# Patient Record
Sex: Female | Born: 1935 | Race: White | Hispanic: No | Smoking: Former smoker
Health system: Southern US, Community
[De-identification: ages and names within clinical notes are randomized; demographics above are authoritative.]

## PROBLEM LIST (undated history)

## (undated) DIAGNOSIS — M1990 Unspecified osteoarthritis, unspecified site: Secondary | ICD-10-CM

## (undated) DIAGNOSIS — I350 Nonrheumatic aortic (valve) stenosis: Secondary | ICD-10-CM

## (undated) DIAGNOSIS — R053 Chronic cough: Secondary | ICD-10-CM

## (undated) DIAGNOSIS — H401132 Primary open-angle glaucoma, bilateral, moderate stage: Secondary | ICD-10-CM

## (undated) DIAGNOSIS — H409 Unspecified glaucoma: Secondary | ICD-10-CM

## (undated) DIAGNOSIS — K760 Fatty (change of) liver, not elsewhere classified: Secondary | ICD-10-CM

## (undated) DIAGNOSIS — G894 Chronic pain syndrome: Secondary | ICD-10-CM

## (undated) DIAGNOSIS — I447 Left bundle-branch block, unspecified: Secondary | ICD-10-CM

## (undated) DIAGNOSIS — N183 Chronic kidney disease, stage 3 unspecified: Secondary | ICD-10-CM

## (undated) DIAGNOSIS — R7303 Prediabetes: Secondary | ICD-10-CM

## (undated) DIAGNOSIS — I10 Essential (primary) hypertension: Secondary | ICD-10-CM

## (undated) DIAGNOSIS — L719 Rosacea, unspecified: Secondary | ICD-10-CM

## (undated) DIAGNOSIS — E785 Hyperlipidemia, unspecified: Secondary | ICD-10-CM

## (undated) DIAGNOSIS — Z961 Presence of intraocular lens: Secondary | ICD-10-CM

## (undated) DIAGNOSIS — R05 Cough: Secondary | ICD-10-CM

## (undated) DIAGNOSIS — G629 Polyneuropathy, unspecified: Secondary | ICD-10-CM

## (undated) DIAGNOSIS — R202 Paresthesia of skin: Secondary | ICD-10-CM

## (undated) HISTORY — PX: DILATION AND CURETTAGE, DIAGNOSTIC / THERAPEUTIC: SUR384

## (undated) HISTORY — DX: Chronic pain syndrome: G89.4

## (undated) HISTORY — PX: CARPAL TUNNEL RELEASE: SHX101

## (undated) HISTORY — DX: Hyperlipidemia, unspecified: E78.5

## (undated) HISTORY — DX: Essential (primary) hypertension: I10

## (undated) HISTORY — DX: Primary open-angle glaucoma, bilateral, moderate stage: H40.1132

## (undated) HISTORY — DX: Presence of intraocular lens: Z96.1

## (undated) HISTORY — DX: Paresthesia of skin: R20.2

---
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
## 2004-08-24 HISTORY — PX: EYE SURGERY: SHX253

## 2013-08-25 HISTORY — PX: CHOLECYSTECTOMY: SHX55

## 2014-10-05 DIAGNOSIS — H4011X1 Primary open-angle glaucoma, mild stage: Secondary | ICD-10-CM | POA: Diagnosis not present

## 2015-02-12 DIAGNOSIS — H4011X1 Primary open-angle glaucoma, mild stage: Secondary | ICD-10-CM | POA: Diagnosis not present

## 2015-03-19 DIAGNOSIS — H4011X1 Primary open-angle glaucoma, mild stage: Secondary | ICD-10-CM | POA: Diagnosis not present

## 2015-03-20 DIAGNOSIS — R7309 Other abnormal glucose: Secondary | ICD-10-CM | POA: Diagnosis not present

## 2015-03-20 DIAGNOSIS — Z0000 Encounter for general adult medical examination without abnormal findings: Secondary | ICD-10-CM | POA: Diagnosis not present

## 2015-03-20 DIAGNOSIS — Z79899 Other long term (current) drug therapy: Secondary | ICD-10-CM | POA: Diagnosis not present

## 2015-03-20 DIAGNOSIS — E6609 Other obesity due to excess calories: Secondary | ICD-10-CM | POA: Diagnosis not present

## 2015-03-20 DIAGNOSIS — Z1211 Encounter for screening for malignant neoplasm of colon: Secondary | ICD-10-CM | POA: Diagnosis not present

## 2015-03-20 DIAGNOSIS — N183 Chronic kidney disease, stage 3 (moderate): Secondary | ICD-10-CM | POA: Diagnosis not present

## 2015-03-20 DIAGNOSIS — Z136 Encounter for screening for cardiovascular disorders: Secondary | ICD-10-CM | POA: Diagnosis not present

## 2015-03-20 DIAGNOSIS — E785 Hyperlipidemia, unspecified: Secondary | ICD-10-CM | POA: Diagnosis not present

## 2015-03-20 DIAGNOSIS — I129 Hypertensive chronic kidney disease with stage 1 through stage 4 chronic kidney disease, or unspecified chronic kidney disease: Secondary | ICD-10-CM | POA: Diagnosis not present

## 2015-03-20 DIAGNOSIS — E162 Hypoglycemia, unspecified: Secondary | ICD-10-CM | POA: Diagnosis not present

## 2015-03-20 DIAGNOSIS — Z1239 Encounter for other screening for malignant neoplasm of breast: Secondary | ICD-10-CM | POA: Diagnosis not present

## 2015-03-20 DIAGNOSIS — Z6831 Body mass index (BMI) 31.0-31.9, adult: Secondary | ICD-10-CM | POA: Diagnosis not present

## 2015-04-06 DIAGNOSIS — Z1231 Encounter for screening mammogram for malignant neoplasm of breast: Secondary | ICD-10-CM | POA: Diagnosis not present

## 2015-04-06 DIAGNOSIS — M810 Age-related osteoporosis without current pathological fracture: Secondary | ICD-10-CM | POA: Diagnosis not present

## 2015-04-06 DIAGNOSIS — M8589 Other specified disorders of bone density and structure, multiple sites: Secondary | ICD-10-CM | POA: Diagnosis not present

## 2015-04-06 DIAGNOSIS — M8580 Other specified disorders of bone density and structure, unspecified site: Secondary | ICD-10-CM | POA: Diagnosis not present

## 2015-05-09 DIAGNOSIS — R42 Dizziness and giddiness: Secondary | ICD-10-CM | POA: Diagnosis not present

## 2015-05-14 DIAGNOSIS — R42 Dizziness and giddiness: Secondary | ICD-10-CM | POA: Diagnosis not present

## 2015-05-14 DIAGNOSIS — K921 Melena: Secondary | ICD-10-CM | POA: Diagnosis not present

## 2015-05-14 DIAGNOSIS — Z79899 Other long term (current) drug therapy: Secondary | ICD-10-CM | POA: Diagnosis not present

## 2015-05-23 DIAGNOSIS — K921 Melena: Secondary | ICD-10-CM | POA: Diagnosis not present

## 2015-07-18 DIAGNOSIS — H401131 Primary open-angle glaucoma, bilateral, mild stage: Secondary | ICD-10-CM | POA: Diagnosis not present

## 2015-07-18 DIAGNOSIS — Z961 Presence of intraocular lens: Secondary | ICD-10-CM | POA: Diagnosis not present

## 2015-09-18 DIAGNOSIS — E785 Hyperlipidemia, unspecified: Secondary | ICD-10-CM | POA: Diagnosis not present

## 2015-09-18 DIAGNOSIS — M109 Gout, unspecified: Secondary | ICD-10-CM | POA: Diagnosis not present

## 2015-09-18 DIAGNOSIS — R7303 Prediabetes: Secondary | ICD-10-CM | POA: Diagnosis not present

## 2015-09-18 DIAGNOSIS — I129 Hypertensive chronic kidney disease with stage 1 through stage 4 chronic kidney disease, or unspecified chronic kidney disease: Secondary | ICD-10-CM | POA: Diagnosis not present

## 2015-09-18 DIAGNOSIS — L821 Other seborrheic keratosis: Secondary | ICD-10-CM | POA: Diagnosis not present

## 2015-09-18 DIAGNOSIS — Z23 Encounter for immunization: Secondary | ICD-10-CM | POA: Diagnosis not present

## 2015-09-18 DIAGNOSIS — N183 Chronic kidney disease, stage 3 (moderate): Secondary | ICD-10-CM | POA: Diagnosis not present

## 2015-09-18 DIAGNOSIS — Z79899 Other long term (current) drug therapy: Secondary | ICD-10-CM | POA: Diagnosis not present

## 2015-11-12 DIAGNOSIS — H401131 Primary open-angle glaucoma, bilateral, mild stage: Secondary | ICD-10-CM | POA: Diagnosis not present

## 2015-11-12 DIAGNOSIS — Z961 Presence of intraocular lens: Secondary | ICD-10-CM | POA: Diagnosis not present

## 2016-03-17 DIAGNOSIS — H401131 Primary open-angle glaucoma, bilateral, mild stage: Secondary | ICD-10-CM | POA: Diagnosis not present

## 2016-03-17 DIAGNOSIS — H0019 Chalazion unspecified eye, unspecified eyelid: Secondary | ICD-10-CM | POA: Diagnosis not present

## 2016-03-19 DIAGNOSIS — E785 Hyperlipidemia, unspecified: Secondary | ICD-10-CM | POA: Diagnosis not present

## 2016-03-19 DIAGNOSIS — M109 Gout, unspecified: Secondary | ICD-10-CM | POA: Diagnosis not present

## 2016-03-19 DIAGNOSIS — D485 Neoplasm of uncertain behavior of skin: Secondary | ICD-10-CM | POA: Diagnosis not present

## 2016-03-19 DIAGNOSIS — Z9181 History of falling: Secondary | ICD-10-CM | POA: Diagnosis not present

## 2016-03-19 DIAGNOSIS — Z87898 Personal history of other specified conditions: Secondary | ICD-10-CM | POA: Diagnosis not present

## 2016-03-19 DIAGNOSIS — R252 Cramp and spasm: Secondary | ICD-10-CM | POA: Diagnosis not present

## 2016-03-19 DIAGNOSIS — R7303 Prediabetes: Secondary | ICD-10-CM | POA: Diagnosis not present

## 2016-03-19 DIAGNOSIS — I129 Hypertensive chronic kidney disease with stage 1 through stage 4 chronic kidney disease, or unspecified chronic kidney disease: Secondary | ICD-10-CM | POA: Diagnosis not present

## 2016-03-19 DIAGNOSIS — Z79899 Other long term (current) drug therapy: Secondary | ICD-10-CM | POA: Diagnosis not present

## 2016-03-19 DIAGNOSIS — N183 Chronic kidney disease, stage 3 (moderate): Secondary | ICD-10-CM | POA: Diagnosis not present

## 2016-03-19 DIAGNOSIS — Z1389 Encounter for screening for other disorder: Secondary | ICD-10-CM | POA: Diagnosis not present

## 2016-03-31 DIAGNOSIS — H00014 Hordeolum externum left upper eyelid: Secondary | ICD-10-CM | POA: Diagnosis not present

## 2016-04-04 DIAGNOSIS — E669 Obesity, unspecified: Secondary | ICD-10-CM | POA: Diagnosis not present

## 2016-04-04 DIAGNOSIS — Z9181 History of falling: Secondary | ICD-10-CM | POA: Diagnosis not present

## 2016-04-04 DIAGNOSIS — E782 Mixed hyperlipidemia: Secondary | ICD-10-CM | POA: Diagnosis not present

## 2016-04-04 DIAGNOSIS — Z6831 Body mass index (BMI) 31.0-31.9, adult: Secondary | ICD-10-CM | POA: Diagnosis not present

## 2016-04-04 DIAGNOSIS — Z0000 Encounter for general adult medical examination without abnormal findings: Secondary | ICD-10-CM | POA: Diagnosis not present

## 2016-04-04 DIAGNOSIS — Z136 Encounter for screening for cardiovascular disorders: Secondary | ICD-10-CM | POA: Diagnosis not present

## 2016-07-16 DIAGNOSIS — H401131 Primary open-angle glaucoma, bilateral, mild stage: Secondary | ICD-10-CM | POA: Diagnosis not present

## 2016-07-24 DIAGNOSIS — Z23 Encounter for immunization: Secondary | ICD-10-CM | POA: Diagnosis not present

## 2016-09-12 DIAGNOSIS — H401131 Primary open-angle glaucoma, bilateral, mild stage: Secondary | ICD-10-CM | POA: Diagnosis not present

## 2016-09-12 DIAGNOSIS — H00025 Hordeolum internum left lower eyelid: Secondary | ICD-10-CM | POA: Diagnosis not present

## 2016-09-12 DIAGNOSIS — Z961 Presence of intraocular lens: Secondary | ICD-10-CM | POA: Diagnosis not present

## 2016-10-02 DIAGNOSIS — M25551 Pain in right hip: Secondary | ICD-10-CM | POA: Diagnosis not present

## 2016-10-02 DIAGNOSIS — R05 Cough: Secondary | ICD-10-CM | POA: Diagnosis not present

## 2016-10-08 DIAGNOSIS — M25551 Pain in right hip: Secondary | ICD-10-CM | POA: Diagnosis not present

## 2016-10-14 DIAGNOSIS — Z79899 Other long term (current) drug therapy: Secondary | ICD-10-CM | POA: Diagnosis not present

## 2016-10-14 DIAGNOSIS — H9190 Unspecified hearing loss, unspecified ear: Secondary | ICD-10-CM | POA: Diagnosis not present

## 2016-10-14 DIAGNOSIS — R7303 Prediabetes: Secondary | ICD-10-CM | POA: Diagnosis not present

## 2016-10-14 DIAGNOSIS — M109 Gout, unspecified: Secondary | ICD-10-CM | POA: Diagnosis not present

## 2016-10-14 DIAGNOSIS — E785 Hyperlipidemia, unspecified: Secondary | ICD-10-CM | POA: Diagnosis not present

## 2016-10-14 DIAGNOSIS — I129 Hypertensive chronic kidney disease with stage 1 through stage 4 chronic kidney disease, or unspecified chronic kidney disease: Secondary | ICD-10-CM | POA: Diagnosis not present

## 2016-10-14 DIAGNOSIS — N183 Chronic kidney disease, stage 3 (moderate): Secondary | ICD-10-CM | POA: Diagnosis not present

## 2016-11-24 DIAGNOSIS — H903 Sensorineural hearing loss, bilateral: Secondary | ICD-10-CM | POA: Diagnosis not present

## 2016-11-24 DIAGNOSIS — Z961 Presence of intraocular lens: Secondary | ICD-10-CM | POA: Diagnosis not present

## 2016-11-24 DIAGNOSIS — H401131 Primary open-angle glaucoma, bilateral, mild stage: Secondary | ICD-10-CM | POA: Diagnosis not present

## 2017-03-30 DIAGNOSIS — Z961 Presence of intraocular lens: Secondary | ICD-10-CM | POA: Diagnosis not present

## 2017-03-30 DIAGNOSIS — H401131 Primary open-angle glaucoma, bilateral, mild stage: Secondary | ICD-10-CM | POA: Diagnosis not present

## 2017-04-07 DIAGNOSIS — E669 Obesity, unspecified: Secondary | ICD-10-CM | POA: Diagnosis not present

## 2017-04-07 DIAGNOSIS — R945 Abnormal results of liver function studies: Secondary | ICD-10-CM | POA: Diagnosis not present

## 2017-04-07 DIAGNOSIS — Z136 Encounter for screening for cardiovascular disorders: Secondary | ICD-10-CM | POA: Diagnosis not present

## 2017-04-07 DIAGNOSIS — Z1212 Encounter for screening for malignant neoplasm of rectum: Secondary | ICD-10-CM | POA: Diagnosis not present

## 2017-04-07 DIAGNOSIS — Z1389 Encounter for screening for other disorder: Secondary | ICD-10-CM | POA: Diagnosis not present

## 2017-04-07 DIAGNOSIS — Z0000 Encounter for general adult medical examination without abnormal findings: Secondary | ICD-10-CM | POA: Diagnosis not present

## 2017-04-07 DIAGNOSIS — E785 Hyperlipidemia, unspecified: Secondary | ICD-10-CM | POA: Diagnosis not present

## 2017-04-07 DIAGNOSIS — Z79899 Other long term (current) drug therapy: Secondary | ICD-10-CM | POA: Diagnosis not present

## 2017-04-07 DIAGNOSIS — Z6831 Body mass index (BMI) 31.0-31.9, adult: Secondary | ICD-10-CM | POA: Diagnosis not present

## 2017-04-14 DIAGNOSIS — R945 Abnormal results of liver function studies: Secondary | ICD-10-CM | POA: Diagnosis not present

## 2017-04-17 DIAGNOSIS — M810 Age-related osteoporosis without current pathological fracture: Secondary | ICD-10-CM | POA: Diagnosis not present

## 2017-04-17 DIAGNOSIS — N959 Unspecified menopausal and perimenopausal disorder: Secondary | ICD-10-CM | POA: Diagnosis not present

## 2017-04-21 DIAGNOSIS — R7303 Prediabetes: Secondary | ICD-10-CM | POA: Diagnosis not present

## 2017-04-21 DIAGNOSIS — E785 Hyperlipidemia, unspecified: Secondary | ICD-10-CM | POA: Diagnosis not present

## 2017-04-21 DIAGNOSIS — M109 Gout, unspecified: Secondary | ICD-10-CM | POA: Diagnosis not present

## 2017-04-21 DIAGNOSIS — Z79899 Other long term (current) drug therapy: Secondary | ICD-10-CM | POA: Diagnosis not present

## 2017-04-21 DIAGNOSIS — N183 Chronic kidney disease, stage 3 (moderate): Secondary | ICD-10-CM | POA: Diagnosis not present

## 2017-04-21 DIAGNOSIS — I129 Hypertensive chronic kidney disease with stage 1 through stage 4 chronic kidney disease, or unspecified chronic kidney disease: Secondary | ICD-10-CM | POA: Diagnosis not present

## 2017-04-21 DIAGNOSIS — K760 Fatty (change of) liver, not elsewhere classified: Secondary | ICD-10-CM | POA: Diagnosis not present

## 2017-04-21 DIAGNOSIS — M810 Age-related osteoporosis without current pathological fracture: Secondary | ICD-10-CM | POA: Diagnosis not present

## 2017-04-21 DIAGNOSIS — B354 Tinea corporis: Secondary | ICD-10-CM | POA: Diagnosis not present

## 2017-04-21 DIAGNOSIS — R200 Anesthesia of skin: Secondary | ICD-10-CM | POA: Diagnosis not present

## 2017-04-28 DIAGNOSIS — R932 Abnormal findings on diagnostic imaging of liver and biliary tract: Secondary | ICD-10-CM | POA: Diagnosis not present

## 2017-04-28 DIAGNOSIS — K760 Fatty (change of) liver, not elsewhere classified: Secondary | ICD-10-CM | POA: Diagnosis not present

## 2017-05-01 DIAGNOSIS — M810 Age-related osteoporosis without current pathological fracture: Secondary | ICD-10-CM | POA: Diagnosis not present

## 2017-05-05 DIAGNOSIS — R109 Unspecified abdominal pain: Secondary | ICD-10-CM | POA: Diagnosis not present

## 2017-05-05 DIAGNOSIS — R933 Abnormal findings on diagnostic imaging of other parts of digestive tract: Secondary | ICD-10-CM | POA: Diagnosis not present

## 2017-05-06 DIAGNOSIS — K760 Fatty (change of) liver, not elsewhere classified: Secondary | ICD-10-CM | POA: Diagnosis not present

## 2017-05-13 DIAGNOSIS — G609 Hereditary and idiopathic neuropathy, unspecified: Secondary | ICD-10-CM | POA: Diagnosis not present

## 2017-05-26 DIAGNOSIS — N183 Chronic kidney disease, stage 3 (moderate): Secondary | ICD-10-CM | POA: Diagnosis not present

## 2017-05-26 DIAGNOSIS — Z23 Encounter for immunization: Secondary | ICD-10-CM | POA: Diagnosis not present

## 2017-06-22 DIAGNOSIS — Z1211 Encounter for screening for malignant neoplasm of colon: Secondary | ICD-10-CM | POA: Diagnosis not present

## 2017-07-28 DIAGNOSIS — R7303 Prediabetes: Secondary | ICD-10-CM | POA: Diagnosis not present

## 2017-07-28 DIAGNOSIS — M109 Gout, unspecified: Secondary | ICD-10-CM | POA: Diagnosis not present

## 2017-07-28 DIAGNOSIS — Z79899 Other long term (current) drug therapy: Secondary | ICD-10-CM | POA: Diagnosis not present

## 2017-07-28 DIAGNOSIS — M810 Age-related osteoporosis without current pathological fracture: Secondary | ICD-10-CM | POA: Diagnosis not present

## 2017-07-28 DIAGNOSIS — N183 Chronic kidney disease, stage 3 (moderate): Secondary | ICD-10-CM | POA: Diagnosis not present

## 2017-07-28 DIAGNOSIS — D492 Neoplasm of unspecified behavior of bone, soft tissue, and skin: Secondary | ICD-10-CM | POA: Diagnosis not present

## 2017-07-28 DIAGNOSIS — E785 Hyperlipidemia, unspecified: Secondary | ICD-10-CM | POA: Diagnosis not present

## 2017-07-28 DIAGNOSIS — I129 Hypertensive chronic kidney disease with stage 1 through stage 4 chronic kidney disease, or unspecified chronic kidney disease: Secondary | ICD-10-CM | POA: Diagnosis not present

## 2017-07-31 DIAGNOSIS — H401131 Primary open-angle glaucoma, bilateral, mild stage: Secondary | ICD-10-CM | POA: Diagnosis not present

## 2017-07-31 DIAGNOSIS — Z961 Presence of intraocular lens: Secondary | ICD-10-CM | POA: Diagnosis not present

## 2017-08-10 DIAGNOSIS — C4442 Squamous cell carcinoma of skin of scalp and neck: Secondary | ICD-10-CM | POA: Diagnosis not present

## 2017-08-10 DIAGNOSIS — L578 Other skin changes due to chronic exposure to nonionizing radiation: Secondary | ICD-10-CM | POA: Diagnosis not present

## 2017-08-10 DIAGNOSIS — L821 Other seborrheic keratosis: Secondary | ICD-10-CM | POA: Diagnosis not present

## 2017-08-10 DIAGNOSIS — L570 Actinic keratosis: Secondary | ICD-10-CM | POA: Diagnosis not present

## 2017-08-31 DIAGNOSIS — L578 Other skin changes due to chronic exposure to nonionizing radiation: Secondary | ICD-10-CM | POA: Diagnosis not present

## 2017-08-31 DIAGNOSIS — L570 Actinic keratosis: Secondary | ICD-10-CM | POA: Diagnosis not present

## 2017-08-31 DIAGNOSIS — L820 Inflamed seborrheic keratosis: Secondary | ICD-10-CM | POA: Diagnosis not present

## 2017-08-31 DIAGNOSIS — L821 Other seborrheic keratosis: Secondary | ICD-10-CM | POA: Diagnosis not present

## 2017-08-31 DIAGNOSIS — L700 Acne vulgaris: Secondary | ICD-10-CM | POA: Diagnosis not present

## 2017-09-08 DIAGNOSIS — R932 Abnormal findings on diagnostic imaging of liver and biliary tract: Secondary | ICD-10-CM | POA: Diagnosis not present

## 2017-09-08 DIAGNOSIS — R748 Abnormal levels of other serum enzymes: Secondary | ICD-10-CM | POA: Diagnosis not present

## 2017-09-25 DIAGNOSIS — R202 Paresthesia of skin: Secondary | ICD-10-CM | POA: Insufficient documentation

## 2017-09-25 HISTORY — DX: Paresthesia of skin: R20.2

## 2017-11-17 DIAGNOSIS — M25511 Pain in right shoulder: Secondary | ICD-10-CM | POA: Diagnosis not present

## 2017-11-17 DIAGNOSIS — M109 Gout, unspecified: Secondary | ICD-10-CM | POA: Diagnosis not present

## 2017-11-17 DIAGNOSIS — R7303 Prediabetes: Secondary | ICD-10-CM | POA: Diagnosis not present

## 2017-11-17 DIAGNOSIS — I129 Hypertensive chronic kidney disease with stage 1 through stage 4 chronic kidney disease, or unspecified chronic kidney disease: Secondary | ICD-10-CM | POA: Diagnosis not present

## 2017-11-17 DIAGNOSIS — Z79899 Other long term (current) drug therapy: Secondary | ICD-10-CM | POA: Diagnosis not present

## 2017-11-17 DIAGNOSIS — E785 Hyperlipidemia, unspecified: Secondary | ICD-10-CM | POA: Diagnosis not present

## 2017-11-17 DIAGNOSIS — N183 Chronic kidney disease, stage 3 (moderate): Secondary | ICD-10-CM | POA: Diagnosis not present

## 2017-11-17 DIAGNOSIS — M810 Age-related osteoporosis without current pathological fracture: Secondary | ICD-10-CM | POA: Diagnosis not present

## 2017-11-26 DIAGNOSIS — M810 Age-related osteoporosis without current pathological fracture: Secondary | ICD-10-CM | POA: Diagnosis not present

## 2017-11-30 DIAGNOSIS — Z961 Presence of intraocular lens: Secondary | ICD-10-CM

## 2017-11-30 DIAGNOSIS — H401132 Primary open-angle glaucoma, bilateral, moderate stage: Secondary | ICD-10-CM

## 2017-11-30 HISTORY — DX: Presence of intraocular lens: Z96.1

## 2017-11-30 HISTORY — DX: Primary open-angle glaucoma, bilateral, moderate stage: H40.1132

## 2017-12-01 DIAGNOSIS — L720 Epidermal cyst: Secondary | ICD-10-CM | POA: Diagnosis not present

## 2017-12-01 DIAGNOSIS — L570 Actinic keratosis: Secondary | ICD-10-CM | POA: Diagnosis not present

## 2017-12-01 DIAGNOSIS — L578 Other skin changes due to chronic exposure to nonionizing radiation: Secondary | ICD-10-CM | POA: Diagnosis not present

## 2017-12-01 DIAGNOSIS — L821 Other seborrheic keratosis: Secondary | ICD-10-CM | POA: Diagnosis not present

## 2017-12-01 DIAGNOSIS — C44329 Squamous cell carcinoma of skin of other parts of face: Secondary | ICD-10-CM | POA: Diagnosis not present

## 2018-01-06 DIAGNOSIS — G894 Chronic pain syndrome: Secondary | ICD-10-CM

## 2018-01-06 DIAGNOSIS — R202 Paresthesia of skin: Secondary | ICD-10-CM | POA: Diagnosis not present

## 2018-01-06 HISTORY — DX: Chronic pain syndrome: G89.4

## 2018-02-22 DIAGNOSIS — R202 Paresthesia of skin: Secondary | ICD-10-CM | POA: Diagnosis not present

## 2018-02-22 DIAGNOSIS — G894 Chronic pain syndrome: Secondary | ICD-10-CM | POA: Diagnosis not present

## 2018-03-01 DIAGNOSIS — L570 Actinic keratosis: Secondary | ICD-10-CM | POA: Diagnosis not present

## 2018-03-23 DIAGNOSIS — R7303 Prediabetes: Secondary | ICD-10-CM | POA: Diagnosis not present

## 2018-03-23 DIAGNOSIS — I129 Hypertensive chronic kidney disease with stage 1 through stage 4 chronic kidney disease, or unspecified chronic kidney disease: Secondary | ICD-10-CM | POA: Diagnosis not present

## 2018-03-23 DIAGNOSIS — M109 Gout, unspecified: Secondary | ICD-10-CM | POA: Diagnosis not present

## 2018-03-23 DIAGNOSIS — E785 Hyperlipidemia, unspecified: Secondary | ICD-10-CM | POA: Diagnosis not present

## 2018-03-25 DIAGNOSIS — M7989 Other specified soft tissue disorders: Secondary | ICD-10-CM | POA: Diagnosis not present

## 2018-03-25 DIAGNOSIS — M19072 Primary osteoarthritis, left ankle and foot: Secondary | ICD-10-CM | POA: Diagnosis not present

## 2018-03-31 DIAGNOSIS — H903 Sensorineural hearing loss, bilateral: Secondary | ICD-10-CM | POA: Diagnosis not present

## 2018-03-31 DIAGNOSIS — Z961 Presence of intraocular lens: Secondary | ICD-10-CM | POA: Diagnosis not present

## 2018-03-31 DIAGNOSIS — H401132 Primary open-angle glaucoma, bilateral, moderate stage: Secondary | ICD-10-CM | POA: Diagnosis not present

## 2018-04-01 DIAGNOSIS — R600 Localized edema: Secondary | ICD-10-CM | POA: Diagnosis not present

## 2018-04-01 DIAGNOSIS — M7989 Other specified soft tissue disorders: Secondary | ICD-10-CM | POA: Diagnosis not present

## 2018-04-01 DIAGNOSIS — M79662 Pain in left lower leg: Secondary | ICD-10-CM | POA: Diagnosis not present

## 2018-04-06 DIAGNOSIS — M19011 Primary osteoarthritis, right shoulder: Secondary | ICD-10-CM | POA: Diagnosis not present

## 2018-04-08 DIAGNOSIS — N200 Calculus of kidney: Secondary | ICD-10-CM | POA: Diagnosis not present

## 2018-04-08 DIAGNOSIS — Q6239 Other obstructive defects of renal pelvis and ureter: Secondary | ICD-10-CM | POA: Diagnosis not present

## 2018-04-08 DIAGNOSIS — N10 Acute tubulo-interstitial nephritis: Secondary | ICD-10-CM | POA: Diagnosis not present

## 2018-04-30 DIAGNOSIS — R202 Paresthesia of skin: Secondary | ICD-10-CM | POA: Diagnosis not present

## 2018-04-30 DIAGNOSIS — M5442 Lumbago with sciatica, left side: Secondary | ICD-10-CM | POA: Diagnosis not present

## 2018-04-30 DIAGNOSIS — G894 Chronic pain syndrome: Secondary | ICD-10-CM | POA: Diagnosis not present

## 2018-04-30 DIAGNOSIS — M5441 Lumbago with sciatica, right side: Secondary | ICD-10-CM | POA: Diagnosis not present

## 2018-05-03 DIAGNOSIS — L570 Actinic keratosis: Secondary | ICD-10-CM | POA: Diagnosis not present

## 2018-05-03 DIAGNOSIS — L578 Other skin changes due to chronic exposure to nonionizing radiation: Secondary | ICD-10-CM | POA: Diagnosis not present

## 2018-05-04 DIAGNOSIS — M19011 Primary osteoarthritis, right shoulder: Secondary | ICD-10-CM | POA: Diagnosis not present

## 2018-05-11 DIAGNOSIS — G8929 Other chronic pain: Secondary | ICD-10-CM | POA: Diagnosis not present

## 2018-05-11 DIAGNOSIS — M5441 Lumbago with sciatica, right side: Secondary | ICD-10-CM | POA: Diagnosis not present

## 2018-05-11 DIAGNOSIS — M5442 Lumbago with sciatica, left side: Secondary | ICD-10-CM | POA: Diagnosis not present

## 2018-05-19 DIAGNOSIS — E785 Hyperlipidemia, unspecified: Secondary | ICD-10-CM

## 2018-05-19 DIAGNOSIS — I10 Essential (primary) hypertension: Secondary | ICD-10-CM

## 2018-05-19 HISTORY — DX: Essential (primary) hypertension: I10

## 2018-05-19 HISTORY — DX: Hyperlipidemia, unspecified: E78.5

## 2018-05-27 DIAGNOSIS — M48061 Spinal stenosis, lumbar region without neurogenic claudication: Secondary | ICD-10-CM | POA: Diagnosis not present

## 2018-05-27 DIAGNOSIS — M5418 Radiculopathy, sacral and sacrococcygeal region: Secondary | ICD-10-CM | POA: Diagnosis not present

## 2018-06-01 ENCOUNTER — Other Ambulatory Visit: Payer: Self-pay | Admitting: Orthopaedic Surgery

## 2018-06-01 DIAGNOSIS — M25511 Pain in right shoulder: Secondary | ICD-10-CM

## 2018-06-03 ENCOUNTER — Other Ambulatory Visit: Payer: Self-pay

## 2018-06-04 ENCOUNTER — Other Ambulatory Visit: Payer: Self-pay

## 2018-06-07 ENCOUNTER — Ambulatory Visit
Admission: RE | Admit: 2018-06-07 | Discharge: 2018-06-07 | Disposition: A | Discharge status: Home / Self Care | Payer: Medicare Other | Source: Ambulatory Visit | Attending: Orthopaedic Surgery | Admitting: Orthopaedic Surgery

## 2018-06-07 DIAGNOSIS — M19011 Primary osteoarthritis, right shoulder: Secondary | ICD-10-CM | POA: Diagnosis not present

## 2018-06-07 DIAGNOSIS — M25511 Pain in right shoulder: Secondary | ICD-10-CM

## 2018-06-23 DIAGNOSIS — Z01818 Encounter for other preprocedural examination: Secondary | ICD-10-CM | POA: Diagnosis not present

## 2018-06-23 DIAGNOSIS — E559 Vitamin D deficiency, unspecified: Secondary | ICD-10-CM | POA: Diagnosis not present

## 2018-06-23 DIAGNOSIS — R9431 Abnormal electrocardiogram [ECG] [EKG]: Secondary | ICD-10-CM | POA: Diagnosis not present

## 2018-06-23 DIAGNOSIS — Z23 Encounter for immunization: Secondary | ICD-10-CM | POA: Diagnosis not present

## 2018-07-07 ENCOUNTER — Encounter: Payer: Self-pay | Admitting: *Deleted

## 2018-07-07 ENCOUNTER — Other Ambulatory Visit: Payer: Self-pay | Admitting: *Deleted

## 2018-07-08 ENCOUNTER — Encounter: Payer: Self-pay | Admitting: Cardiology

## 2018-07-08 ENCOUNTER — Ambulatory Visit (INDEPENDENT_AMBULATORY_CARE_PROVIDER_SITE_OTHER): Payer: Medicare Other | Admitting: Cardiology

## 2018-07-08 VITALS — BP 120/66 | HR 64 | Ht 61.0 in | Wt 161.0 lb

## 2018-07-08 DIAGNOSIS — I447 Left bundle-branch block, unspecified: Secondary | ICD-10-CM

## 2018-07-08 DIAGNOSIS — E782 Mixed hyperlipidemia: Secondary | ICD-10-CM

## 2018-07-08 DIAGNOSIS — I10 Essential (primary) hypertension: Secondary | ICD-10-CM | POA: Diagnosis not present

## 2018-07-08 HISTORY — DX: Left bundle-branch block, unspecified: I44.7

## 2018-07-08 NOTE — Patient Instructions (Signed)
Medication Instructions:  Your physician recommends that you continue on your current medications as directed. Please refer to the Current Medication list given to you today.  If you need a refill on your cardiac medications before your next appointment, please call your pharmacy.   Lab work: None.  If you have labs (blood work) drawn today and your tests are completely normal, you will receive your results only by: Marland Kitchen MyChart Message (if you have MyChart) OR . A paper copy in the mail If you have any lab test that is abnormal or we need to change your treatment, we will call you to review the results.  Testing/Procedures: Your physician has requested that you have a lexiscan myoview. For further information please visit [email or web address]. Please follow instruction sheet, as given.  Your physician has requested that you have an echocardiogram. Echocardiography is a painless test that uses sound waves to create images of your heart. It provides your doctor with information about the size and shape of your heart and how well your heart's chambers and valves are working. This procedure takes approximately one hour. There are no restrictions for this procedure.    Follow-Up: At [facility], you and your health needs are our priority.  As part of our continuing mission to provide you with exceptional heart care, we have created designated Provider Care Teams.  These Care Teams include your primary Cardiologist (physician) and Advanced Practice Providers (APPs -  Physician Assistants and Nurse Practitioners) who all work together to provide you with the care you need, when you need it. You will need a follow up appointment in 1 months.  Please call our office 2 months in advance to schedule this appointment.  You may see No primary care provider on file. or another member of our [employer] Provider Team in [place]: Shirlee More, MD . Jyl Heinz, MD  Any Other Special Instructions  Will Be Listed Below (If Applicable).  Cardiac Nuclear Scan A cardiac nuclear scan is a test that measures blood flow to the heart when a person is resting and when he or she is exercising. The test looks for problems such as:  Not enough blood reaching a portion of the heart.  The heart muscle not working normally.  You may need this test if:  You have heart disease.  You have had abnormal lab results.  You have had heart surgery or angioplasty.  You have chest pain.  You have shortness of breath.  In this test, a radioactive dye (tracer) is injected into your bloodstream. After the tracer has traveled to your heart, an imaging device is used to measure how much of the tracer is absorbed by or distributed to various areas of your heart. This procedure is usually done at a hospital and takes 2-4 hours. Tell a health care provider about:  Any allergies you have.  All medicines you are taking, including vitamins, herbs, eye drops, creams, and over-the-counter medicines.  Any problems you or family members have had with the use of anesthetic medicines.  Any blood disorders you have.  Any surgeries you have had.  Any medical conditions you have.  Whether you are pregnant or may be pregnant. What are the risks? Generally, this is a safe procedure. However, problems may occur, including:  Serious chest pain and heart attack. This is only a risk if the stress portion of the test is done.  Rapid heartbeat.  Sensation of warmth in your chest. This usually passes quickly.  What  happens before the procedure?  Ask your health care provider about changing or stopping your regular medicines. This is especially important if you are taking diabetes medicines or blood thinners.  Remove your jewelry on the day of the procedure. What happens during the procedure?  An IV tube will be inserted into one of your veins.  Your health care provider will inject a small amount of  radioactive tracer through the tube.  You will wait for 20-40 minutes while the tracer travels through your bloodstream.  Your heart activity will be monitored with an electrocardiogram (ECG).  You will lie down on an exam table.  Images of your heart will be taken for about 15-20 minutes.  You may be asked to exercise on a treadmill or stationary bike. While you exercise, your heart's activity will be monitored with an ECG, and your blood pressure will be checked. If you are unable to exercise, you may be given a medicine to increase blood flow to parts of your heart.  When blood flow to your heart has peaked, a tracer will again be injected through the IV tube.  After 20-40 minutes, you will get back on the exam table and have more images taken of your heart.  When the procedure is over, your IV tube will be removed. The procedure may vary among health care providers and hospitals. Depending on the type of tracer used, scans may need to be repeated 3-4 hours later. What happens after the procedure?  Unless your health care provider tells you otherwise, you may return to your normal schedule, including diet, activities, and medicines.  Unless your health care provider tells you otherwise, you may increase your fluid intake. This will help flush the contrast dye from your body. Drink enough fluid to keep your urine clear or pale yellow.  It is up to you to get your test results. Ask your health care provider, or the department that is doing the test, when your results will be ready. Summary  A cardiac nuclear scan measures the blood flow to the heart when a person is resting and when he or she is exercising.  You may need this test if you are at risk for heart disease.  Tell your health care provider if you are pregnant.  Unless your health care provider tells you otherwise, increase your fluid intake. This will help flush the contrast dye from your body. Drink enough fluid to keep  your urine clear or pale yellow. This information is not intended to replace advice given to you by your health care provider. Make sure you discuss any questions you have with your health care provider. Document Released: 09/05/2004 Document Revised: 08/13/2016 Document Reviewed: 07/20/2013 Elsevier Interactive Patient Education  2017 [employer].  Echocardiogram An echocardiogram, or echocardiography, uses sound waves (ultrasound) to produce an image of your heart. The echocardiogram is simple, painless, obtained within a short period of time, and offers valuable information to your health care provider. The images from an echocardiogram can provide information such as:  Evidence of coronary artery disease (CAD).  Heart size.  Heart muscle function.  Heart valve function.  Aneurysm detection.  Evidence of a past heart attack.  Fluid buildup around the heart.  Heart muscle thickening.  Assess heart valve function.  Tell a health care provider about:  Any allergies you have.  All medicines you are taking, including vitamins, herbs, eye drops, creams, and over-the-counter medicines.  Any problems you or family members have had with  anesthetic medicines.  Any blood disorders you have.  Any surgeries you have had.  Any medical conditions you have.  Whether you are pregnant or may be pregnant. What happens before the procedure? No special preparation is needed. Eat and drink normally. What happens during the procedure?  In order to produce an image of your heart, gel will be applied to your chest and a wand-like tool (transducer) will be moved over your chest. The gel will help transmit the sound waves from the transducer. The sound waves will harmlessly bounce off your heart to allow the heart images to be captured in real-time motion. These images will then be recorded.  You may need an IV to receive a medicine that improves the quality of the pictures. What happens  after the procedure? You may return to your normal schedule including diet, activities, and medicines, unless your health care provider tells you otherwise. This information is not intended to replace advice given to you by your health care provider. Make sure you discuss any questions you have with your health care provider. Document Released: 08/08/2000 Document Revised: 03/29/2016 Document Reviewed: 04/18/2013 Elsevier Interactive Patient Education  2017 [employer].

## 2018-07-08 NOTE — Progress Notes (Signed)
Cardiology Consultation:    Date:  07/08/2018   ID:  [name], DOB [birth year], MRN [id number]  PCP:  Renaldo Reel, PA  Cardiologist:  Jenne Campus, MD   Referring MD: Orlinda Blalock, NP   Chief Complaint  Patient presents with  . Abnormal ECG  . Pre-op Exam  I have left bundle branch block and needs surgery from a shoulder  History of Present Illness:    Molly Ortega is a 84 y.o. female who is being seen today for the evaluation of left bundle branch block at the request of Orlinda Blalock, NP.  Who was referred to [place] because of abnormal EKG.  She has multiple medical issues that include hypertension borderline diabetes dyslipidemia she does have remote history of smoking but she quit many years ago multiple family members with premature coronary artery disease and sudden cardiac death.  She comes to [place] because her EKG showed left bundle branch block and EKG was done for the first time when she was evaluated before elective right shoulder replacement surgery.  She denies having dizziness or passing out she move around with the cane described to have some exertional shortness of breath.  Not paroxysmal nocturnal dyspnea no tightness squeezing pressure burning in the chest.  EKG that showed left bundle branch block was incidental discovery.  Past Medical History:  Diagnosis Date  . Chronic pain syndrome 01/06/2018  . Hyperlipidemia 05/19/2018  . Hypertension 05/19/2018  . Paresthesias 09/25/2017  . Primary open angle glaucoma (POAG) of both eyes, moderate stage 11/30/2017  . Pseudophakia of both eyes 11/30/2017      Current Medications: Current Meds  Medication Sig  . allopurinol (ZYLOPRIM) 100 MG tablet TK 1 T PO ONCE D  . dorzolamide (TRUSOPT) 2 % ophthalmic solution 1 drop 2 (two) times daily. 1 drop in affected eye(s) twice daily  . latanoprost (XALATAN) 0.005 % ophthalmic solution Apply 1 drop to eye at bedtime. Instill 1 drop into both eyes every night at bedtime  .  losartan (COZAAR) 25 MG tablet Take by mouth.  . magnesium oxide (MAG-OX) 400 MG tablet magnesium oxide 400 mg (241.3 mg magnesium) tablet  TK 1 T PO ONCE D  . Multiple Vitamin (MULTI-VITAMINS) TABS Take by mouth.  . niacin 500 MG tablet Take by mouth.  . Omega-3 1000 MG CAPS Take by mouth.  . rosuvastatin (CRESTOR) 10 MG tablet TK 1 T PO D     Allergies:   Patient has no known allergies.   Social History   Socioeconomic History  . Marital status: Widowed    Spouse name: Not on file  . Number of children: Not on file  . Years of education: Not on file  . Highest education level: Not on file  Occupational History  . Not on file  Social Needs  . Financial resource strain: Not on file  . Food insecurity:    Worry: Not on file    Inability: Not on file  . Transportation needs:    Medical: Not on file    Non-medical: Not on file  Tobacco Use  . Smoking status: Former Research scientist (life sciences)  . Smokeless tobacco: Never Used  Substance and Sexual Activity  . Alcohol use: Not Currently  . Drug use: Never  . Sexual activity: Not on file  Lifestyle  . Physical activity:    Days per week: Not on file    Minutes per session: Not on file  . Stress: Not on file  Relationships  .  Social connections:    Talks on phone: Not on file    Gets together: Not on file    Attends religious service: Not on file    Active member of club or organization: Not on file    Attends meetings of clubs or organizations: Not on file    Relationship status: Not on file  Other Topics Concern  . Not on file  Social History Narrative  . Not on file     Family History: The patient's family history includes Diabetes in her father; Heart attack in her father and mother; Lung cancer in her mother. ROS:   Please see the history of present illness.    All 14 point review of systems negative except as described per history of present illness.  EKGs/Labs/Other Studies Reviewed:    The following studies were reviewed  today:   EKG:  EKG is  ordered today.  The ekg ordered today demonstrates normal sinus rhythm normal P interval left bundle branch block.  Recent Labs: No results found for requested labs within last 8760 hours.  Recent Lipid Panel No results found for: CHOL, TRIG, HDL, CHOLHDL, VLDL, LDLCALC, LDLDIRECT  Physical Exam:    VS:  BP 120/66   Pulse 64   Ht 5\' 1"  (1.549 m)   Wt 161 lb (73 kg)   BMI 30.42 kg/m     Wt Readings from Last 3 Encounters:  07/08/18 161 lb (73 kg)     GEN:  Well nourished, well developed in no acute distress HEENT: Normal NECK: No JVD; No carotid bruits LYMPHATICS: No lymphadenopathy CARDIAC: RRR, holosystolic murmur best heard left border of the sternum grade 2/6., no rubs, no gallops RESPIRATORY:  Clear to auscultation without rales, wheezing or rhonchi  ABDOMEN: Soft, non-tender, non-distended MUSCULOSKELETAL:  No edema; No deformity  SKIN: Warm and dry NEUROLOGIC:  Alert and oriented x 3 PSYCHIATRIC:  Normal affect   ASSESSMENT:    1. Left bundle branch block   2. Essential hypertension   3. Mixed hyperlipidemia    PLAN:    In order of problems listed above:  1. Left bundle branch block which is incidental discovery in this lady with multiple risk factors for coronary artery disease.  I will ask her to have echocardiogram echocardiogram will assess left ventricular ejection fraction.  She also does have systolic murmur on the physical examination and that will be clarified by echocardiogram.  As a part of evaluation because of multiple risk factors for coronary artery disease ask her to have a stress test we will do Alpha. 2. Essential hypertension blood pressure appears to be controlled we will continue present medications. 3. Dyslipidemia with last LDL that I have available is from summer of this year when LDL was 59 HDL 46.  We will continue present management. 4. Preop evaluation for shoulder surgery because of her new left bundle branch  block we will do investigation as outlined above and then will decide about proceeding with surgery.   Medication Adjustments/Labs and Tests Ordered: Current medicines are reviewed at length with the patient today.  Concerns regarding medicines are outlined above.  Orders Placed This Encounter  Procedures  . MYOCARDIAL PERFUSION IMAGING  . ECHOCARDIOGRAM COMPLETE   No orders of the defined types were placed in this encounter.   Signed, Park Liter, MD, [facility]. 07/08/2018 12:46 PM    [place]

## 2018-07-09 ENCOUNTER — Encounter: Payer: Self-pay | Admitting: Cardiology

## 2018-07-13 DIAGNOSIS — M109 Gout, unspecified: Secondary | ICD-10-CM | POA: Diagnosis not present

## 2018-07-13 DIAGNOSIS — E785 Hyperlipidemia, unspecified: Secondary | ICD-10-CM | POA: Diagnosis not present

## 2018-07-13 DIAGNOSIS — I129 Hypertensive chronic kidney disease with stage 1 through stage 4 chronic kidney disease, or unspecified chronic kidney disease: Secondary | ICD-10-CM | POA: Diagnosis not present

## 2018-07-13 DIAGNOSIS — R7303 Prediabetes: Secondary | ICD-10-CM | POA: Diagnosis not present

## 2018-07-16 ENCOUNTER — Ambulatory Visit (HOSPITAL_BASED_OUTPATIENT_CLINIC_OR_DEPARTMENT_OTHER): Payer: Medicare Other

## 2018-07-19 ENCOUNTER — Ambulatory Visit (HOSPITAL_BASED_OUTPATIENT_CLINIC_OR_DEPARTMENT_OTHER)
Admission: RE | Admit: 2018-07-19 | Discharge: 2018-07-19 | Disposition: A | Discharge status: Home / Self Care | Payer: Medicare Other | Source: Ambulatory Visit | Attending: Cardiology | Admitting: Cardiology

## 2018-07-19 DIAGNOSIS — I447 Left bundle-branch block, unspecified: Secondary | ICD-10-CM | POA: Diagnosis not present

## 2018-07-19 NOTE — Progress Notes (Signed)
  Echocardiogram 2D Echocardiogram has been performed.  Molly Ortega [name] 07/19/2018, 1:25 PM

## 2018-08-02 DIAGNOSIS — L570 Actinic keratosis: Secondary | ICD-10-CM | POA: Diagnosis not present

## 2018-08-04 DIAGNOSIS — H26491 Other secondary cataract, right eye: Secondary | ICD-10-CM | POA: Diagnosis not present

## 2018-08-04 DIAGNOSIS — H401132 Primary open-angle glaucoma, bilateral, moderate stage: Secondary | ICD-10-CM | POA: Diagnosis not present

## 2018-08-04 DIAGNOSIS — Z961 Presence of intraocular lens: Secondary | ICD-10-CM | POA: Diagnosis not present

## 2018-08-24 ENCOUNTER — Telehealth: Payer: Self-pay | Admitting: *Deleted

## 2018-08-24 NOTE — Telephone Encounter (Signed)
Patient given detailed instructions per Myocardial Perfusion Study Information Sheet for the test on 08/31/18. Patient notified to arrive 15 minutes early and that it is imperative to arrive on time for appointment to keep from having the test rescheduled.  If you need to cancel or reschedule your appointment, please call the office within 24 hours of your appointment. . Patient verbalized understanding.Molly Ortega

## 2018-08-31 ENCOUNTER — Ambulatory Visit (INDEPENDENT_AMBULATORY_CARE_PROVIDER_SITE_OTHER): Payer: Medicare Other

## 2018-08-31 VITALS — Ht 61.0 in | Wt 161.0 lb

## 2018-08-31 DIAGNOSIS — I447 Left bundle-branch block, unspecified: Secondary | ICD-10-CM

## 2018-08-31 DIAGNOSIS — E782 Mixed hyperlipidemia: Secondary | ICD-10-CM

## 2018-08-31 DIAGNOSIS — I10 Essential (primary) hypertension: Secondary | ICD-10-CM

## 2018-08-31 MED ORDER — TECHNETIUM TC 99M TETROFOSMIN IV KIT
32.8000 | PACK | Freq: Once | INTRAVENOUS | Status: AC | PRN
  Administered 2018-08-31: 32.8 via INTRAVENOUS

## 2018-08-31 MED ORDER — TECHNETIUM TC 99M TETROFOSMIN IV KIT
10.6000 | PACK | Freq: Once | INTRAVENOUS | Status: AC | PRN
  Administered 2018-08-31: 10.6 via INTRAVENOUS

## 2018-08-31 MED ORDER — REGADENOSON 0.4 MG/5ML IV SOLN
0.4000 mg | Freq: Once | INTRAVENOUS | Status: AC
  Administered 2018-08-31: 0.4 mg via INTRAVENOUS

## 2018-09-01 ENCOUNTER — Ambulatory Visit: Payer: Medicare Other | Admitting: Cardiology

## 2018-09-01 DIAGNOSIS — G894 Chronic pain syndrome: Secondary | ICD-10-CM | POA: Diagnosis not present

## 2018-09-01 DIAGNOSIS — M48062 Spinal stenosis, lumbar region with neurogenic claudication: Secondary | ICD-10-CM

## 2018-09-01 HISTORY — DX: Spinal stenosis, lumbar region with neurogenic claudication: M48.062

## 2018-09-02 ENCOUNTER — Ambulatory Visit: Payer: Medicare Other | Admitting: Cardiology

## 2018-09-02 LAB — MYOCARDIAL PERFUSION IMAGING
CHL CUP NUCLEAR SDS: 3
CHL CUP NUCLEAR SRS: 1
CHL CUP NUCLEAR SSS: 4
LVDIAVOL: 52 mL (ref 46–106)
LVSYSVOL: 12 mL
NUC STRESS TID: 1.07
Peak HR: 75 {beats}/min
Rest HR: 62 {beats}/min

## 2018-10-01 ENCOUNTER — Ambulatory Visit (INDEPENDENT_AMBULATORY_CARE_PROVIDER_SITE_OTHER): Payer: Medicare Other | Admitting: Cardiology

## 2018-10-01 ENCOUNTER — Encounter: Payer: Self-pay | Admitting: Cardiology

## 2018-10-01 VITALS — BP 122/74 | HR 58 | Ht 61.0 in | Wt 165.6 lb

## 2018-10-01 DIAGNOSIS — I10 Essential (primary) hypertension: Secondary | ICD-10-CM | POA: Diagnosis not present

## 2018-10-01 DIAGNOSIS — I447 Left bundle-branch block, unspecified: Secondary | ICD-10-CM

## 2018-10-01 DIAGNOSIS — E782 Mixed hyperlipidemia: Secondary | ICD-10-CM | POA: Diagnosis not present

## 2018-10-01 NOTE — Progress Notes (Signed)
Cardiology Office Note:    Date:  10/01/2018   ID:  Molly Ortega, DOB [birth year], MRN [id number]  PCP:  Renaldo Reel, PA  Cardiologist:  Jenne Campus, MD    Referring MD: Renaldo Reel, [place]   Chief Complaint  Patient presents with  . Follow up on Testing  I am doing well I am here to talk about test  History of Present Illness:    Molly Ortega is a 85 y.o. female with incidentally discovered left bundle branch block.  She was also scheduled to have shoulder surgery surgery was postponed because of abnormal EKG she was referred to [place] she does not have any symptoms that would suggest coronary artery disease but because of this abnormal EKG I did echocardiogram which showed preserved left ventricular ejection fraction left ventricle hypertrophy mild mitral stenosis mild aortic stenosis, stress test was also done which was normal.  She comes here today to talk about is doing well from cardiac standpoint review she still move a lot at home slowly with some shortness of breath but otherwise seems to be doing well.  Past Medical History:  Diagnosis Date  . Chronic pain syndrome 01/06/2018  . Hyperlipidemia 05/19/2018  . Hypertension 05/19/2018  . Paresthesias 09/25/2017  . Primary open angle glaucoma (POAG) of both eyes, moderate stage 11/30/2017  . Pseudophakia of both eyes 11/30/2017      Current Medications: Current Meds  Medication Sig  . allopurinol (ZYLOPRIM) 100 MG tablet TK 1 T PO ONCE D  . dorzolamide (TRUSOPT) 2 % ophthalmic solution 1 drop 2 (two) times daily. 1 drop in affected eye(s) twice daily  . latanoprost (XALATAN) 0.005 % ophthalmic solution Apply 1 drop to eye at bedtime. Instill 1 drop into both eyes every night at bedtime  . losartan (COZAAR) 25 MG tablet Take by mouth.  . magnesium oxide (MAG-OX) 400 MG tablet magnesium oxide 400 mg (241.3 mg magnesium) tablet  TK 1 T PO ONCE D  . Multiple Vitamin (MULTI-VITAMINS) TABS Take by mouth.  . niacin 500 MG tablet  Take by mouth.  . Omega-3 1000 MG CAPS Take by mouth.  . Potassium 99 MG TABS Take 1 tablet by mouth daily.  . rosuvastatin (CRESTOR) 10 MG tablet TK 1 T PO D     Allergies:   Patient has no known allergies.   Social History   Socioeconomic History  . Marital status: Widowed    Spouse name: Not on file  . Number of children: Not on file  . Years of education: Not on file  . Highest education level: Not on file  Occupational History  . Not on file  Social Needs  . Financial resource strain: Not on file  . Food insecurity:    Worry: Not on file    Inability: Not on file  . Transportation needs:    Medical: Not on file    Non-medical: Not on file  Tobacco Use  . Smoking status: Former Research scientist (life sciences)  . Smokeless tobacco: Never Used  Substance and Sexual Activity  . Alcohol use: Not Currently  . Drug use: Never  . Sexual activity: Not on file  Lifestyle  . Physical activity:    Days per week: Not on file    Minutes per session: Not on file  . Stress: Not on file  Relationships  . Social connections:    Talks on phone: Not on file    Gets together: Not on file    Attends religious service:  Not on file    Active member of club or organization: Not on file    Attends meetings of clubs or organizations: Not on file    Relationship status: Not on file  Other Topics Concern  . Not on file  Social History Narrative  . Not on file     Family History: The patient's family history includes Diabetes in her father; Heart attack in her father and mother; Lung cancer in her mother. ROS:   Please see the history of present illness.    All 14 point review of systems negative except as described per history of present illness  EKGs/Labs/Other Studies Reviewed:      Recent Labs: No results found for requested labs within last 8760 hours.  Recent Lipid Panel No results found for: CHOL, TRIG, HDL, CHOLHDL, VLDL, LDLCALC, LDLDIRECT  Physical Exam:    VS:  BP 122/74   Pulse (!) 58    Ht 5\' 1"  (1.549 m)   Wt 165 lb 9.6 oz (75.1 kg)   SpO2 98%   BMI 31.29 kg/m     Wt Readings from Last 3 Encounters:  10/01/18 165 lb 9.6 oz (75.1 kg)  08/31/18 161 lb (73 kg)  07/08/18 161 lb (73 kg)     GEN:  Well nourished, well developed in no acute distress HEENT: Normal NECK: No JVD; No carotid bruits LYMPHATICS: No lymphadenopathy CARDIAC: RRR, no murmurs, no rubs, no gallops RESPIRATORY:  Clear to auscultation without rales, wheezing or rhonchi  ABDOMEN: Soft, non-tender, non-distended MUSCULOSKELETAL:  No edema; No deformity  SKIN: Warm and dry LOWER EXTREMITIES: no swelling NEUROLOGIC:  Alert and oriented x 3 PSYCHIATRIC:  Normal affect   ASSESSMENT:    1. Left bundle branch block   2. Essential hypertension   3. Mixed hyperlipidemia    PLAN:    In order of problems listed above:  1. Left bundle branch block incidental discovery.  Echocardiogram normal stress test no evidence of ischemia. 2. Essential hypertension blood pressure well controlled today we will continue present management. 3. Dyslipidemia she is on Crestor which I will continue. 4. Preop evaluation for right shoulder surgery.  She does have left bundle branch block by echocardiogram showed preserved left ventricular ejection fraction stress test was normal therefore I think we can proceed with surgery as scheduled.   Medication Adjustments/Labs and Tests Ordered: Current medicines are reviewed at length with the patient today.  Concerns regarding medicines are outlined above.  No orders of the defined types were placed in this encounter.  Medication changes: No orders of the defined types were placed in this encounter.   Signed, Park Liter, MD, [facility] 10/01/2018 10:23 AM    [place]

## 2018-10-01 NOTE — Patient Instructions (Signed)
Medication Instructions:  .isntcur  If you need a refill on your cardiac medications before your next appointment, please call your pharmacy.   Lab work: None.  If you have labs (blood work) drawn today and your tests are completely normal, you will receive your results only by: Marland Kitchen MyChart Message (if you have MyChart) OR . A paper copy in the mail If you have any lab test that is abnormal or we need to change your treatment, we will call you to review the results.  Testing/Procedures: None.  Follow-Up: At [facility], you and your health needs are our priority.  As part of our continuing mission to provide you with exceptional heart care, we have created designated Provider Care Teams.  These Care Teams include your primary Cardiologist (physician) and Advanced Practice Providers (APPs -  Physician Assistants and Nurse Practitioners) who all work together to provide you with the care you need, when you need it. You will need a follow up appointment in 6 months.  Please call our office 2 months in advance to schedule this appointment.  You may see No primary care provider on file. or another member of our [employer] Provider Team in [place]: Shirlee More, MD . Jyl Heinz, MD  Any Other Special Instructions Will Be Listed Below (If Applicable).

## 2018-10-28 ENCOUNTER — Other Ambulatory Visit: Payer: Self-pay

## 2018-11-24 ENCOUNTER — Encounter (HOSPITAL_COMMUNITY): Payer: Medicare Other

## 2018-11-25 DIAGNOSIS — R7303 Prediabetes: Secondary | ICD-10-CM | POA: Diagnosis not present

## 2018-11-25 DIAGNOSIS — E785 Hyperlipidemia, unspecified: Secondary | ICD-10-CM | POA: Diagnosis not present

## 2018-11-25 DIAGNOSIS — M109 Gout, unspecified: Secondary | ICD-10-CM | POA: Diagnosis not present

## 2018-11-25 DIAGNOSIS — I129 Hypertensive chronic kidney disease with stage 1 through stage 4 chronic kidney disease, or unspecified chronic kidney disease: Secondary | ICD-10-CM | POA: Diagnosis not present

## 2018-11-26 DIAGNOSIS — R7303 Prediabetes: Secondary | ICD-10-CM | POA: Diagnosis not present

## 2018-11-26 DIAGNOSIS — E785 Hyperlipidemia, unspecified: Secondary | ICD-10-CM | POA: Diagnosis not present

## 2018-11-26 DIAGNOSIS — M810 Age-related osteoporosis without current pathological fracture: Secondary | ICD-10-CM | POA: Diagnosis not present

## 2018-12-01 ENCOUNTER — Encounter (HOSPITAL_COMMUNITY): Payer: Self-pay

## 2018-12-01 ENCOUNTER — Inpatient Hospital Stay (HOSPITAL_COMMUNITY): Admit: 2018-12-01 | Payer: 59 | Admitting: Orthopaedic Surgery

## 2018-12-01 SURGERY — ARTHROPLASTY, SHOULDER, TOTAL, REVERSE
Anesthesia: Choice | Laterality: Right

## 2018-12-16 DIAGNOSIS — M810 Age-related osteoporosis without current pathological fracture: Secondary | ICD-10-CM | POA: Diagnosis not present

## 2019-01-19 DIAGNOSIS — M48062 Spinal stenosis, lumbar region with neurogenic claudication: Secondary | ICD-10-CM | POA: Diagnosis not present

## 2019-01-19 DIAGNOSIS — G894 Chronic pain syndrome: Secondary | ICD-10-CM | POA: Diagnosis not present

## 2019-01-20 DIAGNOSIS — M19012 Primary osteoarthritis, left shoulder: Secondary | ICD-10-CM | POA: Diagnosis not present

## 2019-01-20 DIAGNOSIS — R29898 Other symptoms and signs involving the musculoskeletal system: Secondary | ICD-10-CM | POA: Diagnosis not present

## 2019-01-27 NOTE — Progress Notes (Signed)
10/04/2018- Cardiac Clearance on chart from Dr. Agustin Cree  10/01/2018- noted in [place] visit from Dr. Agustin Cree  09/14/2018- Medical Clearance on chart from Alfonzo Feller   09/02/2018- noted in Epic-Stress test  07/19/2018- noted in Epic-ECHO   07/13/2018-  On chart office visit from Dr. Delena Bali and labs-CMP, lipid panel,HgA1C  07/08/2018- noted in [place]  06/23/2018- on chart-office visit from Orlinda Blalock, NP

## 2019-01-27 NOTE — Patient Instructions (Addendum)
YOU NEED TO HAVE A COVID 19 TEST ON_____Monday June 8th_______, THIS TEST MUST BE DONE BEFORE SURGERY, COME TO [place] ENTRANCE.                 Molly Ortega   Your procedure is scheduled on: Wednesday 02/02/2019  Report to [facility] Main  Entrance              Report to Short Stay at   [place] AM                  Call this number if you have problems the morning of surgery [phone]      Remember: Do not eat food  :After Midnight.                NO SOLID FOOD AFTER MIDNIGHT THE NIGHT PRIOR TO SURGERY. NOTHING BY MOUTH EXCEPT CLEAR LIQUIDS UNTIL 0430 am.               PLEASE FINISH G2 GATORADE DRINK PER SURGEON ORDER  WHICH NEEDS TO BE COMPLETED AT   0430 am.     CLEAR LIQUID DIET   Foods Allowed                                                                     Foods Excluded  Coffee and tea, regular and decaf                             liquids that you cannot  Plain Jell-O in any flavor                                             see through such as: Fruit ices (not with fruit pulp)                                     milk, soups, orange juice  Iced Popsicles                                    All solid food Carbonated beverages, regular and diet                                    Cranberry, grape and apple juices Sports drinks like Gatorade Lightly seasoned clear broth or consume(fat free) Sugar, honey syrup  Sample Menu Breakfast                                Lunch                                     Supper Cranberry juice  Beef broth                            Chicken broth Jell-O                                     Grape juice                           Apple juice Coffee or tea                        Jell-O                                      Popsicle                                                Coffee or tea                        Coffee or  tea  _____________________________________________________________________               BRUSH YOUR TEETH MORNING OF SURGERY AND RINSE YOUR MOUTH OUT, NO CHEWING GUM CANDY OR MINTS.     Take these medicines the morning of surgery with A SIP OF WATER: Allopurinol (Zyloprim), Rosuvastatin (Crestor), use eye drops                                  You may not have any metal on your body including hair pins and              piercings  Do not wear jewelry, make-up, lotions, powders or perfumes, deodorant             Do not wear nail polish.  Do not shave  48 hours prior to surgery.               Do not bring valuables to the hospital. [place].  Contacts, dentures or bridgework may not be worn into surgery.  Leave suitcase in the car. After surgery it may be brought to your room.                  Please read over the following fact sheets you were given: _____________________________________________________________________             [facility] - Preparing for Surgery Before surgery, you can play an important role.  Because skin is not sterile, your skin needs to be as free of germs as possible.  You can reduce the number of germs on your skin by washing with CHG (chlorahexidine gluconate) soap before surgery.  CHG is an antiseptic cleaner which kills germs and bonds with the skin to continue killing germs even after washing. Please DO NOT use if you have an allergy to CHG or antibacterial soaps.  If your skin becomes reddened/irritated stop using the CHG and inform  your nurse when you arrive at Short Stay. Do not shave (including legs and underarms) for at least 48 hours prior to the first CHG shower.  You may shave your face/neck. Please follow these instructions carefully:  1.  Shower with CHG Soap the night before surgery and the  morning of Surgery.  2.  If you choose to wash your hair, wash your hair first as usual with your   normal  shampoo.  3.  After you shampoo, rinse your hair and body thoroughly to remove the  shampoo.                           4.  Use CHG as you would any other liquid soap.  You can apply chg directly  to the skin and wash                       Gently with a scrungie or clean washcloth.  5.  Apply the CHG Soap to your body ONLY FROM THE NECK DOWN.   Do not use on face/ open                           Wound or open sores. Avoid contact with eyes, ears mouth and genitals (private parts).                       Wash face,  Genitals (private parts) with your normal soap.             6.  Wash thoroughly, paying special attention to the area where your surgery  will be performed.  7.  Thoroughly rinse your body with warm water from the neck down.  8.  DO NOT shower/wash with your normal soap after using and rinsing off  the CHG Soap.                9.  Pat yourself dry with a clean towel.            10.  Wear clean pajamas.            11.  Place clean sheets on your bed the night of your first shower and do not  sleep with pets. Day of Surgery : Do not apply any lotions/deodorants the morning of surgery.  Please wear clean clothes to the hospital/surgery center.  FAILURE TO FOLLOW THESE INSTRUCTIONS MAY RESULT IN THE CANCELLATION OF YOUR SURGERY PATIENT SIGNATURE_________________________________  NURSE SIGNATURE__________________________________  ________________________________________________________________________   Molly Ortega  An incentive spirometer is a tool that can help keep your lungs clear and active. This tool measures how well you are filling your lungs with each breath. Taking long deep breaths may help reverse or decrease the chance of developing breathing (pulmonary) problems (especially infection) following:  A long period of time when you are unable to move or be active. BEFORE THE PROCEDURE   If the spirometer includes an indicator to show your best effort, your  nurse or respiratory therapist will set it to a desired goal.  If possible, sit up straight or lean slightly forward. Try not to slouch.  Hold the incentive spirometer in an upright position. INSTRUCTIONS FOR USE  1. Sit on the edge of your bed if possible, or sit up as far as you can in bed or on a chair. 2. Hold the incentive spirometer in an upright position.  3. Breathe out normally. 4. Place the mouthpiece in your mouth and seal your lips tightly around it. 5. Breathe in slowly and as deeply as possible, raising the piston or the ball toward the top of the column. 6. Hold your breath for 3-5 seconds or for as long as possible. Allow the piston or ball to fall to the bottom of the column. 7. Remove the mouthpiece from your mouth and breathe out normally. 8. Rest for a few seconds and repeat Steps 1 through 7 at least 10 times every 1-2 hours when you are awake. Take your time and take a few normal breaths between deep breaths. 9. The spirometer may include an indicator to show your best effort. Use the indicator as a goal to work toward during each repetition. 10. After each set of 10 deep breaths, practice coughing to be sure your lungs are clear. If you have an incision (the cut made at the time of surgery), support your incision when coughing by placing a pillow or rolled up towels firmly against it. Once you are able to get out of bed, walk around indoors and cough well. You may stop using the incentive spirometer when instructed by your caregiver.  RISKS AND COMPLICATIONS  Take your time so you do not get dizzy or light-headed.  If you are in pain, you may need to take or ask for pain medication before doing incentive spirometry. It is harder to take a deep breath if you are having pain. AFTER USE  Rest and breathe slowly and easily.  It can be helpful to keep track of a log of your progress. Your caregiver can provide you with a simple table to help with this. If you are using the  spirometer at home, follow these instructions: [place] IF:   You are having difficultly using the spirometer.  You have trouble using the spirometer as often as instructed.  Your pain medication is not giving enough relief while using the spirometer.  You develop fever of 100.5 F (38.1 C) or higher. SEEK IMMEDIATE MEDICAL CARE IF:   You cough up bloody sputum that had not been present before.  You develop fever of 102 F (38.9 C) or greater.  You develop worsening pain at or near the incision site. MAKE SURE YOU:   Understand these instructions.  Will watch your condition.  Will get help right away if you are not doing well or get worse. Document Released: 12/22/2006 Document Revised: 11/03/2011 Document Reviewed: 02/22/2007 [facility] Patient Information 2014 [place], [place].   ________________________________________________________________________

## 2019-01-28 ENCOUNTER — Encounter (HOSPITAL_COMMUNITY)
Admission: RE | Admit: 2019-01-28 | Discharge: 2019-01-28 | Disposition: A | Discharge status: Home / Self Care | Payer: Medicare Other | Source: Ambulatory Visit | Attending: Orthopaedic Surgery | Admitting: Orthopaedic Surgery

## 2019-01-28 ENCOUNTER — Other Ambulatory Visit: Payer: Self-pay

## 2019-01-28 ENCOUNTER — Encounter (HOSPITAL_COMMUNITY): Payer: Self-pay

## 2019-01-28 DIAGNOSIS — Z01812 Encounter for preprocedural laboratory examination: Secondary | ICD-10-CM | POA: Insufficient documentation

## 2019-01-28 DIAGNOSIS — Z1159 Encounter for screening for other viral diseases: Secondary | ICD-10-CM | POA: Diagnosis not present

## 2019-01-28 HISTORY — DX: Nonrheumatic aortic (valve) stenosis: I35.0

## 2019-01-28 HISTORY — DX: Rosacea, unspecified: L71.9

## 2019-01-28 HISTORY — DX: Fatty (change of) liver, not elsewhere classified: K76.0

## 2019-01-28 HISTORY — DX: Unspecified glaucoma: H40.9

## 2019-01-28 HISTORY — DX: Unspecified osteoarthritis, unspecified site: M19.90

## 2019-01-28 HISTORY — DX: Polyneuropathy, unspecified: G62.9

## 2019-01-28 HISTORY — DX: Left bundle-branch block, unspecified: I44.7

## 2019-01-28 HISTORY — DX: Prediabetes: R73.03

## 2019-01-28 HISTORY — DX: Chronic cough: R05.3

## 2019-01-28 HISTORY — DX: Chronic kidney disease, stage 3 unspecified: N18.30

## 2019-01-28 LAB — COMPREHENSIVE METABOLIC PANEL
ALT: 19 U/L (ref 0–44)
AST: 27 U/L (ref 15–41)
Albumin: 4.4 g/dL (ref 3.5–5.0)
Alkaline Phosphatase: 46 U/L (ref 38–126)
Anion gap: 7 (ref 5–15)
BUN: 24 mg/dL — ABNORMAL HIGH (ref 8–23)
CO2: 27 mmol/L (ref 22–32)
Calcium: 8.6 mg/dL — ABNORMAL LOW (ref 8.9–10.3)
Chloride: 108 mmol/L (ref 98–111)
Creatinine, Ser: 0.98 mg/dL (ref 0.44–1.00)
GFR calc Af Amer: >60 mL/min (ref >60)
GFR calc non Af Amer: 53 mL/min — ABNORMAL LOW (ref >60)
Glucose, Bld: 120 mg/dL — ABNORMAL HIGH (ref 70–99)
Potassium: 3.7 mmol/L (ref 3.5–5.1)
Sodium: 142 mmol/L (ref 135–145)
Total Bilirubin: 0.9 mg/dL (ref 0.3–1.2)
Total Protein: 7.8 g/dL (ref 6.5–8.1)

## 2019-01-28 LAB — CBC
HCT: 39.2 % (ref 36.0–46.0)
Hemoglobin: 12.3 g/dL (ref 12.0–15.0)
MCH: 31.5 pg (ref 26.0–34.0)
MCHC: 31.4 g/dL (ref 30.0–36.0)
MCV: 100.3 fL — ABNORMAL HIGH (ref 80.0–100.0)
Platelets: 182 10*3/uL (ref 150–400)
RBC: 3.91 MIL/uL (ref 3.87–5.11)
RDW: 13.2 % (ref 11.5–15.5)
WBC: 5.9 10*3/uL (ref 4.0–10.5)
nRBC: 0 % (ref 0.0–0.2)

## 2019-01-28 LAB — SURGICAL PCR SCREEN
MRSA, PCR: NEGATIVE
Staphylococcus aureus: NEGATIVE

## 2019-01-28 NOTE — Progress Notes (Signed)
Pre-op nurse progress note   Patient reports no acute cardiac sx at pre-op appt today. VS WDL .      Please see lov cardiology 10-01-2018 regarding Stress Test results . RN unable to find results in epic.

## 2019-01-29 LAB — HEMOGLOBIN A1C
Hgb A1c MFr Bld: 5.6 % (ref 4.8–5.6)
Mean Plasma Glucose: 114 mg/dL

## 2019-01-31 ENCOUNTER — Other Ambulatory Visit (HOSPITAL_COMMUNITY)
Admission: RE | Admit: 2019-01-31 | Discharge: 2019-01-31 | Disposition: A | Discharge status: Home / Self Care | Payer: Medicare Other | Source: Ambulatory Visit | Attending: Orthopaedic Surgery | Admitting: Orthopaedic Surgery

## 2019-01-31 LAB — SARS CORONAVIRUS 2 BY RT PCR (HOSPITAL ORDER, PERFORMED IN ~~LOC~~ HOSPITAL LAB): SARS Coronavirus 2: NEGATIVE

## 2019-01-31 NOTE — Progress Notes (Signed)
Anesthesia Chart Review   Case:  [id number] Date/Time:  02/02/19 0815   Procedure:  REVERSE SHOULDER ARTHROPLASTY (Right )   Anesthesia type:  Choice   Pre-op diagnosis:  djd right shoulder   Location:  [facility] ROOM 06 / [facility]S   Surgeon:  Hiram Gash, MD      DISCUSSION: 85 yo former smoker with h/o HTN, HLD, mild Aortic stenosis, LBBB, chronic cough, pre-diabetes, CKD Stage III, DJD right shoulder scheduled for above procedure 02/02/2019 with Dr. Ophelia Charter.    Pt last seen by cardiologist, Dr. Tristan Schroeder, 10/01/2018.  Per OV note, "Preop evaluation for right shoulder surgery.  She does have left bundle branch block by echocardiogram showed preserved left ventricular ejection fraction stress test was normal therefore I think we can proceed with surgery as scheduled."  Clearance from PCP on chart.   Pt can proceed with planned procedure barring acute status change.  VS: BP 140/71   Pulse 71   Resp 16   Ht 5\' 1"  (1.549 m)   SpO2 97%   BMI 31.29 kg/m   PROVIDERS: Renaldo Reel, PA is PCP   Jenne Campus, MD is Cardiologist  LABS: Labs reviewed: Acceptable for surgery. (all labs ordered are listed, but only abnormal results are displayed)  Labs Reviewed  COMPREHENSIVE METABOLIC PANEL - Abnormal; Notable for the following components:      Result Value   Glucose, Bld 120 (*)    BUN 24 (*)    Calcium 8.6 (*)    GFR calc non Af Amer 53 (*)    All other components within normal limits  CBC - Abnormal; Notable for the following components:   MCV 100.3 (*)    All other components within normal limits  SURGICAL PCR SCREEN  HEMOGLOBIN A1C     IMAGES:   EKG: 08/31/2018 Rate 62 bpm Normal sinus rhythm  Normal ECG   CV: Stress Test 08/31/2018  The left ventricular ejection fraction is hyperdynamic (>65%).  Nuclear stress EF: 78%.  Blood pressure demonstrated a normal response to exercise.  This is a low risk study.  No evidence of ischemia or scar. Normal EF.  Transient LBBB developed in the recovery phase.  Echo 07/19/2018 Study Conclusions  - Left ventricle: The cavity size was normal. There was moderate   concentric hypertrophy. Systolic function was vigorous. The   estimated ejection fraction was in the range of 65% to 70%. Wall   motion was normal; there were no regional wall motion   abnormalities. Doppler parameters are consistent with abnormal   left ventricular relaxation (grade 1 diastolic dysfunction). - Aortic valve: There was mild stenosis. There was trivial   regurgitation. Valve area (VTI): 1.81 cm^2. Valve area (Vmax):   1.69 cm^2. Valve area (Vmean): 1.66 cm^2. - Mitral valve: Calcified annulus. Mobility was restricted. The   findings are consistent with trivial stenosis. Valve area by   continuity equation (using LVOT flow): 1.91 cm^2.  Impressions:  - Normal LVEF.   Moderate LVH.   MIld AS, MIld MS. Past Medical History:  Diagnosis Date  . Arthritis   . Chronic cough    cronic , dry ; patient states " ive had this cough for almost all my life, my old doctor, Dr Marcello Moores thought it might be from some asthma or allergies but never said for sure"  . Chronic kidney disease (CKD), stage III (moderate) (HCC)   . Chronic pain syndrome 01/06/2018  . Glaucoma   . Hepatic  steatosis   . Hyperlipidemia 05/19/2018  . Hypertension 05/19/2018  . Left bundle branch block (LBBB) on electrocardiogram   . Mild aortic valve stenosis   . Paresthesias 09/25/2017  . Peripheral neuropathy   . Pre-diabetes   . Primary open angle glaucoma (POAG) of both eyes, moderate stage 11/30/2017  . Pseudophakia of both eyes 11/30/2017  . Rosacea     Past Surgical History:  Procedure Laterality Date  . CARPAL TUNNEL RELEASE Right   . CHOLECYSTECTOMY  2015  . DILATION AND CURETTAGE, DIAGNOSTIC / THERAPEUTIC  1960s  . EYE SURGERY Bilateral 08/24/2004   cataract Basic no lensx    MEDICATIONS: . allopurinol (ZYLOPRIM) 100 MG tablet  . dorzolamide  (TRUSOPT) 2 % ophthalmic solution  . latanoprost (XALATAN) 0.005 % ophthalmic solution  . losartan (COZAAR) 100 MG tablet  . magnesium oxide (MAG-OX) 400 MG tablet  . Multiple Vitamin (MULTI-VITAMINS) TABS  . niacin 500 MG tablet  . Omega-3 1000 MG CAPS  . Potassium 99 MG TABS  . rosuvastatin (CRESTOR) 10 MG tablet   No current facility-administered medications for this encounter.      Maia Plan [facility] Pre-Surgical Testing [phone] 01/31/19 3:08 PM

## 2019-01-31 NOTE — Anesthesia Preprocedure Evaluation (Addendum)
Anesthesia Evaluation  Patient identified by MRN, date of birth, ID band Patient awake    Reviewed: Allergy & Precautions, NPO status , Patient's Chart, lab work & pertinent test results  History of Anesthesia Complications Negative for: history of anesthetic complications  Airway Mallampati: III  TM Distance: >3 FB Neck ROM: Full    Dental  (+) Edentulous Upper, Edentulous Lower   Pulmonary neg pulmonary ROS, former smoker,    Pulmonary exam normal        Cardiovascular hypertension, Normal cardiovascular exam+ Valvular Problems/Murmurs (mild) AS      Neuro/Psych negative neurological ROS  negative psych ROS   GI/Hepatic negative GI ROS, Neg liver ROS,   Endo/Other  negative endocrine ROS  Renal/GU Renal InsufficiencyRenal disease  negative genitourinary   Musculoskeletal  (+) Arthritis ,   Abdominal   Peds  Hematology negative hematology ROS (+)   Anesthesia Other Findings Pt last seen by cardiologist, Dr. Tristan Schroeder, 10/01/2018.  Per OV note, "Preop evaluation for right shoulder surgery.  She does have left bundle branch block by echocardiogram showed preserved left ventricular ejection fraction stress test was normal therefore I think we can proceed with surgery as scheduled."  Reproductive/Obstetrics                           Anesthesia Physical Anesthesia Plan  ASA: III  Anesthesia Plan: General   Post-op Pain Management: GA combined w/ Regional for post-op pain   Induction: Intravenous  PONV Risk Score and Plan: 3 and Ondansetron, Dexamethasone and Treatment may vary due to age or medical condition  Airway Management Planned: Oral ETT  Additional Equipment: None  Intra-op Plan:   Post-operative Plan: Extubation in OR  Informed Consent: I have reviewed the patients History and Physical, chart, labs and discussed the procedure including the risks, benefits and  alternatives for the proposed anesthesia with the patient or authorized representative who has indicated his/her understanding and acceptance.     Dental advisory given  Plan Discussed with:   Anesthesia Plan Comments: (See PAT note 01/28/2019, Konrad Felix, PA-C)      Anesthesia Quick Evaluation

## 2019-02-01 ENCOUNTER — Other Ambulatory Visit: Payer: Self-pay

## 2019-02-01 NOTE — Progress Notes (Signed)
SPOKE [place] dtr     SCREENING SYMPTOMS OF COVID 19:   COUGH--no  RUNNY NOSE--- no  SORE THROAT---no  NASAL CONGESTION----no  SNEEZING----no  SHORTNESS OF BREATH---no  DIFFICULTY BREATHING---no  TEMP >100.0 -----no  UNEXPLAINED BODY ACHES------no  CHILLS -------- no  HEADACHES ---------no  LOSS OF SMELL/ TASTE --------no    HAVE YOU OR ANY FAMILY MEMBER TRAVELLED PAST 14 DAYS OUT OF THE   COUNTY---no STATE----no COUNTRY----no  HAVE YOU OR ANY FAMILY MEMBER BEEN EXPOSED TO ANYONE WITH COVID 19?  no

## 2019-02-02 ENCOUNTER — Inpatient Hospital Stay (HOSPITAL_COMMUNITY): Payer: Medicare Other | Admitting: Physician Assistant

## 2019-02-02 ENCOUNTER — Encounter (HOSPITAL_COMMUNITY): Payer: Self-pay

## 2019-02-02 ENCOUNTER — Inpatient Hospital Stay (HOSPITAL_COMMUNITY)
Admission: RE | Admit: 2019-02-02 | Discharge: 2019-02-03 | DRG: 483 | Disposition: A | Discharge status: Home / Self Care | Payer: Medicare Other | Attending: Orthopaedic Surgery | Admitting: Orthopaedic Surgery

## 2019-02-02 ENCOUNTER — Inpatient Hospital Stay (HOSPITAL_COMMUNITY): Payer: Medicare Other | Admitting: Anesthesiology

## 2019-02-02 ENCOUNTER — Encounter (HOSPITAL_COMMUNITY)
Admission: RE | Disposition: A | Discharge status: Home / Self Care | Payer: Self-pay | Source: Home / Self Care | Attending: Orthopaedic Surgery

## 2019-02-02 ENCOUNTER — Inpatient Hospital Stay (HOSPITAL_COMMUNITY): Payer: Medicare Other

## 2019-02-02 DIAGNOSIS — Z09 Encounter for follow-up examination after completed treatment for conditions other than malignant neoplasm: Secondary | ICD-10-CM

## 2019-02-02 DIAGNOSIS — Z79899 Other long term (current) drug therapy: Secondary | ICD-10-CM

## 2019-02-02 DIAGNOSIS — Z87891 Personal history of nicotine dependence: Secondary | ICD-10-CM | POA: Diagnosis not present

## 2019-02-02 DIAGNOSIS — K760 Fatty (change of) liver, not elsewhere classified: Secondary | ICD-10-CM | POA: Diagnosis present

## 2019-02-02 DIAGNOSIS — N183 Chronic kidney disease, stage 3 (moderate): Secondary | ICD-10-CM | POA: Diagnosis present

## 2019-02-02 DIAGNOSIS — Z833 Family history of diabetes mellitus: Secondary | ICD-10-CM | POA: Diagnosis not present

## 2019-02-02 DIAGNOSIS — I447 Left bundle-branch block, unspecified: Secondary | ICD-10-CM | POA: Diagnosis present

## 2019-02-02 DIAGNOSIS — Z9049 Acquired absence of other specified parts of digestive tract: Secondary | ICD-10-CM | POA: Diagnosis not present

## 2019-02-02 DIAGNOSIS — G8918 Other acute postprocedural pain: Secondary | ICD-10-CM | POA: Diagnosis not present

## 2019-02-02 DIAGNOSIS — Z471 Aftercare following joint replacement surgery: Secondary | ICD-10-CM | POA: Diagnosis not present

## 2019-02-02 DIAGNOSIS — M12811 Other specific arthropathies, not elsewhere classified, right shoulder: Secondary | ICD-10-CM

## 2019-02-02 DIAGNOSIS — Z801 Family history of malignant neoplasm of trachea, bronchus and lung: Secondary | ICD-10-CM

## 2019-02-02 DIAGNOSIS — M19011 Primary osteoarthritis, right shoulder: Principal | ICD-10-CM | POA: Diagnosis present

## 2019-02-02 DIAGNOSIS — Z96611 Presence of right artificial shoulder joint: Secondary | ICD-10-CM | POA: Diagnosis not present

## 2019-02-02 DIAGNOSIS — M75101 Unspecified rotator cuff tear or rupture of right shoulder, not specified as traumatic: Secondary | ICD-10-CM | POA: Diagnosis present

## 2019-02-02 DIAGNOSIS — Z8249 Family history of ischemic heart disease and other diseases of the circulatory system: Secondary | ICD-10-CM | POA: Diagnosis not present

## 2019-02-02 DIAGNOSIS — H401132 Primary open-angle glaucoma, bilateral, moderate stage: Secondary | ICD-10-CM | POA: Diagnosis present

## 2019-02-02 DIAGNOSIS — I129 Hypertensive chronic kidney disease with stage 1 through stage 4 chronic kidney disease, or unspecified chronic kidney disease: Secondary | ICD-10-CM | POA: Diagnosis present

## 2019-02-02 DIAGNOSIS — I10 Essential (primary) hypertension: Secondary | ICD-10-CM | POA: Diagnosis not present

## 2019-02-02 DIAGNOSIS — E785 Hyperlipidemia, unspecified: Secondary | ICD-10-CM | POA: Diagnosis present

## 2019-02-02 DIAGNOSIS — G894 Chronic pain syndrome: Secondary | ICD-10-CM | POA: Diagnosis present

## 2019-02-02 HISTORY — DX: Unspecified rotator cuff tear or rupture of right shoulder, not specified as traumatic: M75.101

## 2019-02-02 HISTORY — PX: REVERSE SHOULDER ARTHROPLASTY: SHX5054

## 2019-02-02 HISTORY — DX: Other specific arthropathies, not elsewhere classified, right shoulder: M12.811

## 2019-02-02 SURGERY — ARTHROPLASTY, SHOULDER, TOTAL, REVERSE
Anesthesia: General | Laterality: Right

## 2019-02-02 MED ORDER — OXYCODONE HCL 5 MG PO TABS: 5.0000 mg | ORAL_TABLET | Freq: Once | ORAL | Status: DC | PRN

## 2019-02-02 MED ORDER — BUPIVACAINE-EPINEPHRINE (PF) 0.25% -1:200000 IJ SOLN
INTRAMUSCULAR | Status: AC
  Filled 2019-02-02: qty 30

## 2019-02-02 MED ORDER — BUPIVACAINE LIPOSOME 1.3 % IJ SUSP
INTRAMUSCULAR | Status: DC | PRN
  Administered 2019-02-02: 10 mL

## 2019-02-02 MED ORDER — SODIUM CHLORIDE 0.9 % IV SOLN
INTRAVENOUS | Status: DC | PRN
  Administered 2019-02-02: 50 ug/min via INTRAVENOUS

## 2019-02-02 MED ORDER — ONDANSETRON HCL 4 MG/2ML IJ SOLN
INTRAMUSCULAR | Status: AC
  Filled 2019-02-02: qty 2

## 2019-02-02 MED ORDER — ONDANSETRON HCL 4 MG PO TABS: 4.0000 mg | ORAL_TABLET | Freq: Four times a day (QID) | ORAL | Status: DC | PRN

## 2019-02-02 MED ORDER — CEFAZOLIN SODIUM-DEXTROSE 2-4 GM/100ML-% IV SOLN
2.0000 g | INTRAVENOUS | Status: AC
  Administered 2019-02-02: 2 g via INTRAVENOUS
  Filled 2019-02-02: qty 100

## 2019-02-02 MED ORDER — CHLORHEXIDINE GLUCONATE 4 % EX LIQD: 60.0000 mL | Freq: Once | CUTANEOUS | Status: DC

## 2019-02-02 MED ORDER — FENTANYL CITRATE (PF) 100 MCG/2ML IJ SOLN: 25.0000 ug | INTRAMUSCULAR | Status: DC | PRN

## 2019-02-02 MED ORDER — LOSARTAN POTASSIUM 50 MG PO TABS
100.0000 mg | ORAL_TABLET | Freq: Every day | ORAL | Status: DC
  Administered 2019-02-02 – 2019-02-03 (×2): 100 mg via ORAL
  Filled 2019-02-02 (×2): qty 2

## 2019-02-02 MED ORDER — METOCLOPRAMIDE HCL 5 MG PO TABS: 5.0000 mg | ORAL_TABLET | Freq: Three times a day (TID) | ORAL | Status: DC | PRN

## 2019-02-02 MED ORDER — FENTANYL CITRATE (PF) 100 MCG/2ML IJ SOLN
INTRAMUSCULAR | Status: AC
  Filled 2019-02-02: qty 2

## 2019-02-02 MED ORDER — ZOLPIDEM TARTRATE 5 MG PO TABS: 5.0000 mg | ORAL_TABLET | Freq: Every evening | ORAL | Status: DC | PRN

## 2019-02-02 MED ORDER — BUPIVACAINE HCL (PF) 0.5 % IJ SOLN
INTRAMUSCULAR | Status: DC | PRN
  Administered 2019-02-02: 15 mL via PERINEURAL

## 2019-02-02 MED ORDER — ONDANSETRON HCL 4 MG/2ML IJ SOLN: 4.0000 mg | Freq: Four times a day (QID) | INTRAMUSCULAR | Status: DC | PRN

## 2019-02-02 MED ORDER — PROPOFOL 10 MG/ML IV BOLUS
INTRAVENOUS | Status: DC | PRN
  Administered 2019-02-02: 160 mg via INTRAVENOUS

## 2019-02-02 MED ORDER — CEFAZOLIN SODIUM-DEXTROSE 1-4 GM/50ML-% IV SOLN
1.0000 g | Freq: Four times a day (QID) | INTRAVENOUS | Status: AC
  Administered 2019-02-02 – 2019-02-03 (×3): 1 g via INTRAVENOUS
  Filled 2019-02-02 (×3): qty 50

## 2019-02-02 MED ORDER — SUGAMMADEX SODIUM 200 MG/2ML IV SOLN
INTRAVENOUS | Status: DC | PRN
  Administered 2019-02-02: 100 mg via INTRAVENOUS

## 2019-02-02 MED ORDER — DEXAMETHASONE SODIUM PHOSPHATE 10 MG/ML IJ SOLN
INTRAMUSCULAR | Status: AC
  Filled 2019-02-02: qty 1

## 2019-02-02 MED ORDER — STERILE WATER FOR IRRIGATION IR SOLN
Status: DC | PRN
  Administered 2019-02-02: 2000 mL

## 2019-02-02 MED ORDER — LIDOCAINE 2% (20 MG/ML) 5 ML SYRINGE
INTRAMUSCULAR | Status: DC | PRN
  Administered 2019-02-02: 60 mg via INTRAVENOUS

## 2019-02-02 MED ORDER — LACTATED RINGERS IV SOLN
INTRAVENOUS | Status: DC
  Administered 2019-02-02 (×2): via INTRAVENOUS

## 2019-02-02 MED ORDER — PROPOFOL 10 MG/ML IV BOLUS
INTRAVENOUS | Status: AC
  Filled 2019-02-02: qty 20

## 2019-02-02 MED ORDER — 0.9 % SODIUM CHLORIDE (POUR BTL) OPTIME
TOPICAL | Status: DC | PRN
  Administered 2019-02-02: 09:00:00 1000 mL

## 2019-02-02 MED ORDER — ONDANSETRON HCL 4 MG/2ML IJ SOLN
INTRAMUSCULAR | Status: DC | PRN
  Administered 2019-02-02: 4 mg via INTRAVENOUS

## 2019-02-02 MED ORDER — DOCUSATE SODIUM 100 MG PO CAPS
100.0000 mg | ORAL_CAPSULE | Freq: Two times a day (BID) | ORAL | Status: DC
  Administered 2019-02-02 – 2019-02-03 (×2): 100 mg via ORAL
  Filled 2019-02-02 (×2): qty 1

## 2019-02-02 MED ORDER — EPHEDRINE SULFATE-NACL 50-0.9 MG/10ML-% IV SOSY
PREFILLED_SYRINGE | INTRAVENOUS | Status: DC | PRN
  Administered 2019-02-02: 10 mg via INTRAVENOUS

## 2019-02-02 MED ORDER — VANCOMYCIN HCL POWD
Status: DC | PRN
  Administered 2019-02-02: 10:00:00 1000 mg via TOPICAL

## 2019-02-02 MED ORDER — ONDANSETRON HCL 4 MG/2ML IJ SOLN: 4.0000 mg | Freq: Once | INTRAMUSCULAR | Status: DC | PRN

## 2019-02-02 MED ORDER — TRANEXAMIC ACID-NACL 1000-0.7 MG/100ML-% IV SOLN
1000.0000 mg | INTRAVENOUS | Status: AC
  Administered 2019-02-02: 1000 mg via INTRAVENOUS

## 2019-02-02 MED ORDER — FENTANYL CITRATE (PF) 100 MCG/2ML IJ SOLN
INTRAMUSCULAR | Status: DC | PRN
  Administered 2019-02-02 (×2): 50 ug via INTRAVENOUS

## 2019-02-02 MED ORDER — PHENYLEPHRINE HCL (PRESSORS) 10 MG/ML IV SOLN
INTRAVENOUS | Status: AC
  Filled 2019-02-02: qty 1

## 2019-02-02 MED ORDER — ROCURONIUM BROMIDE 10 MG/ML (PF) SYRINGE
PREFILLED_SYRINGE | INTRAVENOUS | Status: DC | PRN
  Administered 2019-02-02: 30 mg via INTRAVENOUS

## 2019-02-02 MED ORDER — HYDROMORPHONE HCL 1 MG/ML IJ SOLN: 0.5000 mg | INTRAMUSCULAR | Status: DC | PRN

## 2019-02-02 MED ORDER — EPHEDRINE 5 MG/ML INJ
INTRAVENOUS | Status: AC
  Filled 2019-02-02: qty 10

## 2019-02-02 MED ORDER — SUGAMMADEX SODIUM 200 MG/2ML IV SOLN
INTRAVENOUS | Status: AC
  Filled 2019-02-02: qty 2

## 2019-02-02 MED ORDER — OXYCODONE HCL 5 MG/5ML PO SOLN: 5.0000 mg | Freq: Once | ORAL | Status: DC | PRN

## 2019-02-02 MED ORDER — ACETAMINOPHEN 500 MG PO TABS
1000.0000 mg | ORAL_TABLET | Freq: Three times a day (TID) | ORAL | Status: DC
  Administered 2019-02-02 – 2019-02-03 (×3): 1000 mg via ORAL
  Filled 2019-02-02 (×3): qty 2

## 2019-02-02 MED ORDER — LIDOCAINE 2% (20 MG/ML) 5 ML SYRINGE
INTRAMUSCULAR | Status: AC
  Filled 2019-02-02: qty 5

## 2019-02-02 MED ORDER — VANCOMYCIN HCL 1000 MG IV SOLR
INTRAVENOUS | Status: AC
  Filled 2019-02-02: qty 1000

## 2019-02-02 MED ORDER — DEXAMETHASONE SODIUM PHOSPHATE 10 MG/ML IJ SOLN
INTRAMUSCULAR | Status: DC | PRN
  Administered 2019-02-02: 10 mg via INTRAVENOUS

## 2019-02-02 MED ORDER — METOCLOPRAMIDE HCL 5 MG/ML IJ SOLN: 5.0000 mg | Freq: Three times a day (TID) | INTRAMUSCULAR | Status: DC | PRN

## 2019-02-02 MED ORDER — TRANEXAMIC ACID-NACL 1000-0.7 MG/100ML-% IV SOLN
INTRAVENOUS | Status: AC
  Filled 2019-02-02: qty 100

## 2019-02-02 MED ORDER — SODIUM CHLORIDE 0.9 % IR SOLN
Status: DC | PRN
  Administered 2019-02-02: 1000 mL

## 2019-02-02 MED ORDER — SUCCINYLCHOLINE CHLORIDE 200 MG/10ML IV SOSY
PREFILLED_SYRINGE | INTRAVENOUS | Status: DC | PRN
  Administered 2019-02-02: 80 mg via INTRAVENOUS

## 2019-02-02 MED ORDER — ROSUVASTATIN CALCIUM 10 MG PO TABS
10.0000 mg | ORAL_TABLET | Freq: Every day | ORAL | Status: DC
  Administered 2019-02-02 – 2019-02-03 (×2): 10 mg via ORAL
  Filled 2019-02-02 (×2): qty 1

## 2019-02-02 MED ORDER — OXYCODONE HCL 5 MG PO TABS: 5.0000 mg | ORAL_TABLET | ORAL | Status: DC | PRN

## 2019-02-02 MED ORDER — SUCCINYLCHOLINE CHLORIDE 200 MG/10ML IV SOSY
PREFILLED_SYRINGE | INTRAVENOUS | Status: AC
  Filled 2019-02-02: qty 10

## 2019-02-02 MED ORDER — DIPHENHYDRAMINE HCL 12.5 MG/5ML PO ELIX: 12.5000 mg | ORAL_SOLUTION | ORAL | Status: DC | PRN

## 2019-02-02 SURGICAL SUPPLY — 62 items
AUG BASEPLATE 15DEG 25 WEDGE (Joint) ×3 IMPLANT
AUGMENT BASEPLATE 15DEG 25 WDG (Joint) ×1 IMPLANT
BIT DRILL 3.2 PERIPHERAL SCREW (BIT) ×3 IMPLANT
BLADE EXTENDED COATED 6.5IN (ELECTRODE) IMPLANT
BLADE SAW SAG 73X25 THK (BLADE) ×2
BLADE SAW SGTL 73X25 THK (BLADE) ×1 IMPLANT
CLOSURE STERI-STRIP 1/2X4 (GAUZE/BANDAGES/DRESSINGS) ×1
CLSR STERI-STRIP ANTIMIC 1/2X4 (GAUZE/BANDAGES/DRESSINGS) ×2 IMPLANT
COVER SURGICAL LIGHT HANDLE (MISCELLANEOUS) ×3 IMPLANT
COVER WAND RF STERILE (DRAPES) ×3 IMPLANT
DRAPE INCISE IOBAN 66X45 STRL (DRAPES) ×3 IMPLANT
DRAPE ORTHO SPLIT 77X108 STRL (DRAPES) ×4
DRAPE SHEET LG 3/4 BI-LAMINATE (DRAPES) ×3 IMPLANT
DRAPE SURG ORHT 6 SPLT 77X108 (DRAPES) ×2 IMPLANT
DRSG AQUACEL AG ADV 3.5X 6 (GAUZE/BANDAGES/DRESSINGS) ×3 IMPLANT
DURAPREP 26ML APPLICATOR (WOUND CARE) ×6 IMPLANT
ELECT BLADE TIP CTD 4 INCH (ELECTRODE) ×3 IMPLANT
ELECT REM PT RETURN 15FT ADLT (MISCELLANEOUS) ×3 IMPLANT
GLENOSPHERE REV SHOULDER 36 (Joint) ×3 IMPLANT
GLOVE BIO SURGEON STRL SZ8 (GLOVE) ×3 IMPLANT
GLOVE BIOGEL PI IND STRL 8 (GLOVE) ×2 IMPLANT
GLOVE BIOGEL PI INDICATOR 8 (GLOVE) ×4
GLOVE ECLIPSE 8.0 STRL XLNG CF (GLOVE) ×6 IMPLANT
GOWN SPEC L3 XXLG W/TWL (GOWN DISPOSABLE) ×3 IMPLANT
GOWN STRL REUS W/ TWL XL LVL3 (GOWN DISPOSABLE) ×1 IMPLANT
GOWN STRL REUS W/TWL XL LVL3 (GOWN DISPOSABLE) ×2
GUIDE GLENOID PATIENT REVERSED (MISCELLANEOUS) ×3 IMPLANT
GUIDEWIRE GLENOID 2.5X220 (WIRE) ×3 IMPLANT
HANDPIECE INTERPULSE COAX TIP (DISPOSABLE) ×2
HEMOSTAT SURGICEL 2X14 (HEMOSTASIS) IMPLANT
IMPL REVERSE SHOULDER 0X3.5 (Shoulder) ×1 IMPLANT
IMPLANT REVERSE SHOULDER 0X3.5 (Shoulder) ×3 IMPLANT
INSERT HUMERAL 36X6MM 12.5DEG (Insert) ×3 IMPLANT
KIT BASIN OR (CUSTOM PROCEDURE TRAY) ×3 IMPLANT
KIT STABILIZATION SHOULDER (MISCELLANEOUS) ×3 IMPLANT
KIT TURNOVER KIT A (KITS) IMPLANT
MANIFOLD NEPTUNE II (INSTRUMENTS) ×3 IMPLANT
NEEDLE HYPO 25X1 1.5 SAFETY (NEEDLE) IMPLANT
NEEDLE MAYO CATGUT SZ4 (NEEDLE) IMPLANT
NS IRRIG 1000ML POUR BTL (IV SOLUTION) ×3 IMPLANT
PACK SHOULDER (CUSTOM PROCEDURE TRAY) ×3 IMPLANT
RESTRAINT HEAD UNIVERSAL NS (MISCELLANEOUS) ×3 IMPLANT
SCREW 5.5X22 (Screw) ×6 IMPLANT
SCREW 5.5X26 (Screw) ×3 IMPLANT
SCREW BONE 6.5X40 SM (Screw) ×3 IMPLANT
SCREW PERIPHERAL 30 (Screw) ×3 IMPLANT
SET HNDPC FAN SPRY TIP SCT (DISPOSABLE) ×1 IMPLANT
SLING ARM IMMOBILIZER MED (SOFTGOODS) ×3 IMPLANT
SLING ULTRA III MED (ORTHOPEDIC SUPPLIES) ×3 IMPLANT
SPONGE LAP 18X18 X RAY DECT (DISPOSABLE) IMPLANT
STEM HUM AEQUALIS STD SZ1B 66 (Stem) ×3 IMPLANT
SUCTION FRAZIER HANDLE 10FR (MISCELLANEOUS) ×2
SUCTION TUBE FRAZIER 10FR DISP (MISCELLANEOUS) ×1 IMPLANT
SUT ETHIBOND 2 V 37 (SUTURE) ×3 IMPLANT
SUT ETHIBOND NAB CT1 #1 30IN (SUTURE) ×3 IMPLANT
SUT FIBERWIRE #5 38 CONV NDL (SUTURE) ×12
SUT MON AB 3-0 SH 27 (SUTURE) ×2
SUT MON AB 3-0 SH27 (SUTURE) ×1 IMPLANT
SUT VIC AB 0 CT1 36 (SUTURE) ×3 IMPLANT
SUTURE FIBERWR #5 38 CONV NDL (SUTURE) ×4 IMPLANT
TOWEL OR 17X26 10 PK STRL BLUE (TOWEL DISPOSABLE) ×3 IMPLANT
WATER STERILE IRR 1000ML POUR (IV SOLUTION) ×6 IMPLANT

## 2019-02-02 NOTE — Transfer of Care (Signed)
Immediate Anesthesia Transfer of Care Note  Patient: Molly Ortega  Procedure(s) Performed: REVERSE SHOULDER ARTHROPLASTY (Right )  Patient Location: PACU  Anesthesia Type:General  Level of Consciousness: awake, alert  and oriented  Airway & Oxygen Therapy: Patient Spontanous Breathing and Patient connected to face mask oxygen  Post-op Assessment: Report given to RN and Post -op Vital signs reviewed and stable  Post vital signs: Reviewed and stable  Last Vitals:  Vitals Value Taken Time  BP 134/101 02/02/2019 10:39 AM  Temp    Pulse 81 02/02/2019 10:43 AM  Resp 24 02/02/2019 10:42 AM  SpO2 100 % 02/02/2019 10:43 AM  Vitals shown include unvalidated device data.  Last Pain:  Vitals:   02/02/19 0712  TempSrc:   PainSc: 2       Patients Stated Pain Goal: 4 (09/92/78 0044)  Complications: No apparent anesthesia complications

## 2019-02-02 NOTE — Anesthesia Procedure Notes (Signed)
Procedure Name: Intubation Date/Time: 02/02/2019 8:32 AM Performed by: [place] Nyzaiah Kai C, CRNA Pre-anesthesia Checklist: Patient identified, Emergency Drugs available, Suction available and Patient being monitored Patient Re-evaluated:Patient Re-evaluated prior to induction Oxygen Delivery Method: Circle system utilized Preoxygenation: Pre-oxygenation with 100% oxygen Induction Type: IV induction Ventilation: Mask ventilation without difficulty Laryngoscope Size: Mac and 3 Grade View: Grade I Tube type: Oral Tube size: 7.0 mm Number of attempts: 1 Airway Equipment and Method: Stylet and Oral airway Placement Confirmation: ETT inserted through vocal cords under direct vision,  positive ETCO2 and breath sounds checked- equal and bilateral Secured at: 21 cm Tube secured with: Tape Dental Injury: Teeth and Oropharynx as per pre-operative assessment

## 2019-02-02 NOTE — Op Note (Signed)
Orthopaedic Surgery Operative Note (CSN: [id number])  Molly Ortega  01-Dec-1930 Date of Surgery: 02/02/2019   Diagnoses:  End-stage rotator cuff arthropathy of the right shoulder with extremely small glenoid vault  Procedure: Right augmented reverse total shoulder arthroplasty  Operative Finding Successful completion of planned procedure.  Patient had outstanding fixation and everything went as well as it possibly could.  Her subscap was not intact enough to really amount to repair.  Teres minor was intact however posteriorly.  Bone quality was poor.  We are able to lateralize the joint as we expected.  We used used Biomedical scientist to [employer] a specific guide for this patient and utilize that for our center pin placement.  Patient had extraordinarily increased retroversion with nearly 90 degrees of retroversion noted.  She had relative increase in her apparent glenoid anteversion nearing neutral.  The guide was essential to create an intact vault which we were able to do.  We were extremely happy with our position of her implants in the setting of her severe degenerative changes.  Implants: Tornier short stem ascend Flex size 1 humerus, 0 high offset tray, 36+6 poly-, full wedge glenoid augmentation 25 mm, 40 mm center screw, 36 with 0 lateralization sphere.  Post-operative plan: The patient will be NWB in sling.  The patient will be admitted overnight.  DVT prophylaxis not indicated in isolated upper extremity surgery patient with no specific risks factors.  Pain control with PRN pain medication preferring oral medicines.  Follow up plan will be scheduled in approximately 7 days for incision check and XR.  Physical therapy to start after 4 weeks.  Post-Op Diagnosis: Same Surgeons:Primary: Hiram Gash, MD Assistants:Brandon Lynnell Jude Location: [facility] ROOM 06 Anesthesia: Choice Antibiotics: Ancef 2g preop, Vancomycin 1000mg  locally Tourniquet time: None Estimated Blood  Loss: 631 Complications: None Specimens: None Implants: Implant Name Type Inv. Item Serial No. Manufacturer Lot No. LRB No. Used Action  AUG BASEPLATE 15DEG 25 WEDGE - [id number] Joint AUG BASEPLATE 15DEG 25 WEDGE  [employer] [id number] Right 1 Implanted  SCREW BONE 6.5X40 SM - [id number] Screw SCREW BONE 6.5X40 SM  [employer]  Right 1 Implanted  SCREW PERIPHERAL 30 - [id number] Screw SCREW PERIPHERAL 30  [employer]  Right 1 Implanted  SCREW 5.5X22 - [id number] Screw SCREW 5.5X22  [employer]  Right 2 Implanted  SCREW 5.5X26 - [id number] Screw SCREW 5.5X26  [employer]  Right 1 Implanted  GLENOSPHERE REV SHOULDER 36 - [id number] Joint GLENOSPHERE REV SHOULDER 36  [employer] [id number] Right 1 Implanted  IMPLANT REVERSE SHOULDER 0X3.5 - [id number] Shoulder IMPLANT REVERSE SHOULDER 0X3.[place] [id number] Right 1 Implanted  STEM HUM AEQUALIS STD SZ1B 66 - [id number] Stem STEM HUM AEQUALIS STD SZ1B 66  [employer] [id number] Right 1 Implanted  INSERT HUMERAL 36X6MM 12.5DEG - [id number] Insert INSERT HUMERAL 36X6MM 12.Tora Perches [facility] [id number] Right 1 Implanted    Indications for Surgery:   Molly Ortega is a 85 y.o. female with end-stage right sided rotator cuff arthropathy.  Is extremely debilitated by this and her family is encouraging her to seek treatment.  She failed nonoperative measures.  We did use blueprint 3D modeling software to template this case and noted an extremely small glenoid vault which necessitated the need for patient specific instrumentation.  Benefits and risks of operative and nonoperative management were discussed prior to surgery with patient/guardian(s) and informed consent form was completed.  Infection and need for further surgery were discussed  as was prosthetic stability and cuff issues.  We additionally specifically discussed risks of axillary nerve injury, infection, periprosthetic fracture, continued pain and longevity of implants prior to beginning procedure.       Procedure:   The patient was identified in the preoperative holding area where the surgical site was marked. Block placed by anesthesia with exparel.  The patient was taken to the OR where a procedural timeout was called and the above noted anesthesia was induced.  The patient was positioned beachchair on allen table with spider arm positioner.  Preoperative antibiotics were dosed.  The patient's right shoulder was prepped and draped in the usual sterile fashion.  A second preoperative timeout was called.      Standard deltopectoral approach was performed with a #10 blade. We dissected down to the subcutaneous tissues and the cephalic vein was taken laterally with the deltoid. Clavipectoral fascia was incised in line with the incision. Deep retractors were placed. The long of the biceps tendon was identified and there was significant tenosynovitis present.  Tenodesis was performed to the pectoralis tendon with #2 Ethibond. The remaining biceps was followed up into the rotator interval where it was released.   The subscapularis remnant was taken down in a full thickness layer with capsule along the humeral neck extending inferiorly around the humeral head. We continued releasing the capsule directly off of the osteophytes inferiorly all the way around the corner. This allowed [place] to dislocate the humeral head.  Significant retroversion and this made this difficult but we were able to do so without any fracture or other abnormality.  The humeral head had evidence of severe osteoarthritic wear with full-thickness cartilage loss and exposed subchondral bone. There was significant flattening of the humeral head.   The rotator cuff was carefully examined and noted to be irreperably torn including subscapularis, supraspinatus and infraspinatus the teres minor was intact.  The decision was confirmed that a reverse total shoulder was indicated for this patient.  There were osteophytes along the inferior  humeral neck. The osteophytes were removed with an osteotome and a rongeur.  Osteophytes were removed with a rongeur and an osteotome and the anatomic neck was well visualized.     A humeral cutting guide was inserted down the intramedullary canal. The version was set at 20 of retroversion. Humeral osteotomy was performed with an oscillating saw. The head fragment was passed off the back table. A starter awl was used to open the humeral canal. We next used T-handle straight sound reamers to ream up to an appropriate fit. A chisel was used to remove proximal humeral bone. We then broached starting with a size one broach which provided good fit in this patient. The broach handle was removed. A cut protector was placed. The broach handle was removed and a cut protector was placed. The humerus was retracted posteriorly and we turned our attention to glenoid exposure.  The subscapularis was again identified and immediately we took care to palpate the axillary nerve anteriorly and verify its position with gentle palpation as well as the tug test.  We then released the SGHL with bovie cautery prior to placing a curved mayo at the junction of the anterior glenoid well above the axillary nerve and bluntly dissecting the subscapularis from the capsule.  We then carefully protected the axillary nerve as we gently released the inferior capsule to fully mobilize the subscapularis.  An anterior deltoid retractor was then placed as well as a small Hohmann retractor superiorly.  The glenoid was inspected and had evidence of severe osteoarthritic wear with full-thickness cartilage loss and exposed subchondral bone. The remaining labrum was removed circumferentially as we needed bony apposition for our guide.  This point we had a great view of the glenoid and had retractors in place.  We used the custom printed guide to place the center pin as was templated.  We were able to place this in the center position about 12 mm  from the inferior aspect of the glenoid.  We able to check the pin position before proceeding with our initial reaming.  We did use the Tornier augmented baseplate with the largest portion of the augment at 1:00.  This obtained good fit.  We used the eccentric 15 degree reamer as is typical in the technique.  We able to get good apposition of the implant to bone.  We remarkably had an intact vault even in the small patient.  Our center screw was drilled to a depth of almost 45 that we selected a 40 mm x 6.5 mm screw as we felt that the 75mm screw may cause our implant to shift.  3 locking screws were placed as is the typical fashion with this implant as well as the superiorly directed nonlocking screw.  At this point we placed a 36 standard glenosphere on without issue.  We turned attention back to the humeral side. The cut protector was removed. We trialed with multiple size tray and polyethylene options and selected a 6 mm which provided good stability and range of motion without excess soft tissue tension. The offset was dialed in to match the normal anatomy. The shoulder was trialed.  There was good ROM in all planes and the shoulder was stable with no inferior translation.  The real humeral implants were opened after again confirming sizes.  We were not able to repair the subscap as there is minimal tissue and thus had to leave it alone.  The humeral component was press-fit obtaining a secure fit. A +0 high offset tray was selected and impacted onto the stem.  A 36+6 polyethylene liner was impacted onto the stem.  The joint was reduced and thoroughly irrigated with pulsatile lavage.Hemostasis was obtained. The deltopectoral interval was reapproximated with #1 Ethibond. The subcutaneous tissues were closed with 2-0 Vicryl and the skin was closed with running monocryl.    The wounds were cleaned and dried and an Aquacel dressing was placed. The drapes taken down. The arm was placed into sling with  abduction pillow. Patient was awakened, extubated, and transferred to the recovery room in stable condition. There were no intraoperative complications. The sponge, needle, and attention counts were  correct at the end of the case.   Joya Gaskins, OPA-C, present and scrubbed throughout the case, critical for completion in a timely fashion, and for retraction, instrumentation, closure.

## 2019-02-02 NOTE — Anesthesia Procedure Notes (Signed)
Anesthesia Regional Block: Interscalene brachial plexus block   Pre-Anesthetic Checklist: ,, timeout performed, Correct Patient, Correct Site, Correct Laterality, Correct Procedure, Correct Position, site marked, Risks and benefits discussed,  Surgical consent,  Pre-op evaluation,  At surgeon's request and post-op pain management  Laterality: Right  Prep: chloraprep       Needles:  Injection technique: Single-shot  Needle Type: Echogenic Stimulator Needle     Needle Length: 9cm  Needle Gauge: 21     Additional Needles:   Procedures:,,,, ultrasound used (permanent image in chart),,,,  Narrative:  Start time: 02/02/2019 8:00 AM End time: 02/02/2019 8:06 AM Injection made incrementally with aspirations every 5 mL.  Performed by: Personally  Anesthesiologist: [name], MD  Additional Notes: Monitors applied. Injection made in 5cc increments. No resistance to injection. Good needle visualization. Patient tolerated procedure well.

## 2019-02-02 NOTE — H&P (Signed)
PREOPERATIVE H&P  Chief Complaint: djd right shoulder  HPI: Molly Ortega is a 85 y.o. female who presents for preoperative history and physical with a diagnosis of djd right shoulder. Symptoms are rated as moderate to severe, and have been worsening.  This is significantly impairing activities of daily living.  Please see my clinic note for full details on this patient's care.  She has elected for surgical management.   Past Medical History:  Diagnosis Date  . Arthritis   . Chronic cough    cronic , dry ; patient states " ive had this cough for almost all my life, my old doctor, Dr Marcello Moores thought it might be from some asthma or allergies but never said for sure"  . Chronic kidney disease (CKD), stage III (moderate) (HCC)   . Chronic pain syndrome 01/06/2018  . Glaucoma   . Hepatic steatosis   . Hyperlipidemia 05/19/2018  . Hypertension 05/19/2018  . Left bundle branch block (LBBB) on electrocardiogram   . Mild aortic valve stenosis   . Paresthesias 09/25/2017  . Peripheral neuropathy   . Pre-diabetes   . Primary open angle glaucoma (POAG) of both eyes, moderate stage 11/30/2017  . Pseudophakia of both eyes 11/30/2017  . Rosacea    Past Surgical History:  Procedure Laterality Date  . CARPAL TUNNEL RELEASE Right   . CHOLECYSTECTOMY  2015  . DILATION AND CURETTAGE, DIAGNOSTIC / THERAPEUTIC  1960s  . EYE SURGERY Bilateral 08/24/2004   cataract Basic no lensx   Social History   Socioeconomic History  . Marital status: Widowed    Spouse name: Not on file  . Number of children: Not on file  . Years of education: Not on file  . Highest education level: Not on file  Occupational History  . Not on file  Social Needs  . Financial resource strain: Not on file  . Food insecurity:    Worry: Not on file    Inability: Not on file  . Transportation needs:    Medical: Not on file    Non-medical: Not on file  Tobacco Use  . Smoking status: Former Research scientist (life sciences)  . Smokeless tobacco: Never Used   Substance and Sexual Activity  . Alcohol use: Not Currently  . Drug use: Never  . Sexual activity: Not on file  Lifestyle  . Physical activity:    Days per week: Not on file    Minutes per session: Not on file  . Stress: Not on file  Relationships  . Social connections:    Talks on phone: Not on file    Gets together: Not on file    Attends religious service: Not on file    Active member of club or organization: Not on file    Attends meetings of clubs or organizations: Not on file    Relationship status: Not on file  Other Topics Concern  . Not on file  Social History Narrative  . Not on file   Family History  Problem Relation Age of Onset  . Heart attack Mother   . Lung cancer Mother   . Diabetes Father   . Heart attack Father    No Known Allergies Prior to Admission medications   Medication Sig Start Date End Date Taking? Authorizing Provider  allopurinol (ZYLOPRIM) 100 MG tablet Take 100 mg by mouth daily.  11/17/17  Yes [provider]  dorzolamide (TRUSOPT) 2 % ophthalmic solution Place 1 drop into both eyes 2 (two) times daily.  02/22/18  Yes [provider]  latanoprost (XALATAN) 0.005 % ophthalmic solution Apply 1 drop to eye at bedtime.  12/06/17  Yes [provider]  losartan (COZAAR) 100 MG tablet Take 100 mg by mouth daily.    Yes [provider]  magnesium oxide (MAG-OX) 400 MG tablet Take 400 mg by mouth daily.    Yes [provider]  Multiple Vitamin (MULTI-VITAMINS) TABS Take 1 tablet by mouth daily.    Yes [provider]  niacin 500 MG tablet Take 500 mg by mouth daily.    Yes [provider]  Omega-3 1000 MG CAPS Take 1,000 mg by mouth daily.    Yes [provider]  Potassium 99 MG TABS Take 99 mg by mouth daily.    Yes [provider]  rosuvastatin (CRESTOR) 10 MG tablet Take 10 mg by mouth daily.  04/21/18  Yes [provider]     Positive ROS: All other systems  have been reviewed and were otherwise negative with the exception of those mentioned in the HPI and as above.  Physical Exam: General: Alert, no acute distress Cardiovascular: No pedal edema Respiratory: No cyanosis, no use of accessory musculature GI: No organomegaly, abdomen is soft and non-tender Skin: No lesions in the area of chief complaint Neurologic: Sensation intact distally Psychiatric: Patient is competent for consent with normal mood and affect Lymphatic: No axillary or cervical lymphadenopathy  MUSCULOSKELETAL: R shoulder: limited ROM, axillary nerve firing, distal NVI  Assessment: djd right shoulder  Plan: Plan for Procedure(s): REVERSE SHOULDER ARTHROPLASTY  The risks benefits and alternatives were discussed with the patient including but not limited to the risks of nonoperative treatment, versus surgical intervention including infection, bleeding, nerve injury,  blood clots, cardiopulmonary complications, morbidity, mortality, among others, and they were willing to proceed.   We additionally specifically discussed risks of axillary nerve injury, infection, periprosthetic fracture, continued pain and longevity of implants prior to beginning procedure.     We spoke with the patient as well as her daughter, Lauro Franklin about the specific risks that this patient has.  Bone quality appears to be poor and her alignment is extraordinarily small.  We had guide sent in to assist in central pin placement but she still at a high risk for intraoperative fracture resulting in the need for hemiarthroplasty instead of a reverse total shoulder arthroplasty.  Family understands this.  Hiram Gash, MD  02/02/2019 8:16 AM

## 2019-02-02 NOTE — Anesthesia Postprocedure Evaluation (Signed)
Anesthesia Post Note  Patient: Molly MCCREADIE  Procedure(s) Performed: REVERSE SHOULDER ARTHROPLASTY (Right )     Patient location during evaluation: PACU Anesthesia Type: General Level of consciousness: awake and alert Pain management: pain level controlled Vital Signs Assessment: post-procedure vital signs reviewed and stable Respiratory status: spontaneous breathing, nonlabored ventilation and respiratory function stable Cardiovascular status: blood pressure returned to baseline and stable Postop Assessment: no apparent nausea or vomiting Anesthetic complications: no    Last Vitals:  Vitals:   02/02/19 1350 02/02/19 1445  BP: (!) 162/52 (!) 170/88  Pulse:  72  Resp: 16 16  Temp: 36.7 C 36.5 C  SpO2: 97% 94%    Last Pain:  Vitals:   02/02/19 1350  TempSrc: Oral  PainSc:                  [name]

## 2019-02-03 MED ORDER — OXYCODONE HCL 5 MG PO TABS: ORAL_TABLET | ORAL | 0 refills | Status: AC

## 2019-02-03 MED ORDER — CELECOXIB 100 MG PO CAPS: 100.0000 mg | ORAL_CAPSULE | Freq: Two times a day (BID) | ORAL | 2 refills | Status: AC

## 2019-02-03 MED ORDER — ONDANSETRON HCL 4 MG PO TABS: 4.0000 mg | ORAL_TABLET | Freq: Three times a day (TID) | ORAL | 1 refills | Status: AC | PRN

## 2019-02-03 MED ORDER — ACETAMINOPHEN 500 MG PO TABS: 1000.0000 mg | ORAL_TABLET | Freq: Three times a day (TID) | ORAL | 0 refills | Status: AC

## 2019-02-03 MED FILL — Vancomycin HCl For IV Soln 1 GM (Base Equivalent): INTRAVENOUS | Qty: 1000 | Status: AC

## 2019-02-03 NOTE — Discharge Summary (Signed)
Patient ID: Molly Ortega MRN: [id number] DOB/AGE: [birth year] 85 y.o.  Admit date: 02/02/2019 Discharge date: 02/03/2019  Admission Diagnoses: Right end-stage rotator cuff arthropathy  Discharge Diagnoses:  Active Problems:   Rotator cuff tear arthropathy, right   Past Medical History:  Diagnosis Date  . Arthritis   . Chronic cough    cronic , dry ; patient states " ive had this cough for almost all my life, my old doctor, Dr Marcello Moores thought it might be from some asthma or allergies but never said for sure"  . Chronic kidney disease (CKD), stage III (moderate) (HCC)   . Chronic pain syndrome 01/06/2018  . Glaucoma   . Hepatic steatosis   . Hyperlipidemia 05/19/2018  . Hypertension 05/19/2018  . Left bundle branch block (LBBB) on electrocardiogram   . Mild aortic valve stenosis   . Paresthesias 09/25/2017  . Peripheral neuropathy   . Pre-diabetes   . Primary open angle glaucoma (POAG) of both eyes, moderate stage 11/30/2017  . Pseudophakia of both eyes 11/30/2017  . Rosacea      Procedures Performed: Right augmented reverse total shoulder arthroplasty  Discharged Condition: good  Hospital Course: Patient brought in as an outpatient for surgery.  Tolerated procedure well.  Was kept for monitoring overnight for pain control and medical monitoring postop and was found to be stable for DC home the morning after surgery.  Patient was instructed on specific activity restrictions and all questions were answered.   Consults: None  Significant Diagnostic Studies: No additional pertinent studies  Treatments: Surgery  Discharge Exam:  Dressing CDI and sling well fitting,  full and painless ROM throughout hand with DPC of 0.  Axillary nerve sensation/motor altered in setting of block and unable to be fully tested.  Distal motor and sensory altered in setting of block.   Disposition: Discharge disposition: 01-Home or Self Care       Discharge Instructions    Call MD for:   persistant nausea and vomiting   Complete by: As directed    Call MD for:  redness, tenderness, or signs of infection (pain, swelling, redness, odor or green/yellow discharge around incision site)   Complete by: As directed    Call MD for:  severe uncontrolled pain   Complete by: As directed    Diet - low sodium heart healthy   Complete by: As directed    Discharge instructions   Complete by: As directed    Ophelia Charter MD, MPH [place]. [address] [phone] (tel)   [phone] (fax)   [name] may leave the operative dressing in place until your follow-up appointment. KEEP THE INCISIONS CLEAN AND DRY. Use the Cryocuff, GameReady or Ice as often as possible for the first 3-4 days, then as needed for pain relief.  You may shower on Post-Op Day #2. The dressing is water resistant but do not scrub it as it may start to peel up.  You may remove the sling for showering, but keep a water resistant pillow under the arm to keep both the elbow and shoulder away from the body (mimicking the abduction sling). Gently pat the area dry. Do not soak the shoulder in water. Do not go swimming in the pool or ocean until your sutures are removed.  EXERCISES Wear the sling at all times except when doing your exercises. You may remove the sling for showering, but keep the arm  across the chest or in a secondary sling.   Accidental/Purposeful External Rotation and shoulder flexion (reaching behind you) is to be avoided at all costs for the first month. Please perform the exercises:   Elbow / Hand / Wrist  Range of Motion Exercises  FOLLOW-UP If you develop a Fever (>101.5), Redness or Drainage from the surgical incision site, please call our office to arrange for an evaluation. Please call the office to schedule a follow-up appointment for a wound check, 7-10 days post-operatively.    IF YOU HAVE  ANY QUESTIONS, PLEASE FEEL FREE TO CALL OUR OFFICE.   HELPFUL INFORMATION  Your arm will be in a sling following surgery. You will be in this sling for the next 3-4 weeks.  I will let you know the exact duration at your follow-up visit.  You may be more comfortable sleeping in a semi-seated position the first few nights following surgery.  Keep a pillow propped under the elbow and forearm for comfort.  If you have a recliner type of chair it might be beneficial.  If not that is fine too, but it would be helpful to sleep propped up with pillows behind your operated shoulder as well under your elbow and forearm.  This will reduce pulling on the suture lines.  We suggest you use the pain medication the first night prior to going to bed, in order to ease any pain when the anesthesia wears off. You should avoid taking pain medications on an empty stomach as it will make you nauseous.  Do not drink alcoholic beverages or take illicit drugs when taking pain medications.  In most states it is against the law to drive while your arm is in a sling. And certainly against the law to drive while taking narcotics.  You may return to work/school in the next couple of days when you feel up to it. Desk work and typing in the sling is fine.  When dressing, put your operative arm in the sleeve first.  When getting undressed, take your operative arm out last.  Loose fitting, button-down shirts are recommended.  Pain medication may make you constipated.  Below are a few solutions to try in this order: Decrease the amount of pain medication if you aren't having pain. Drink lots of decaffeinated fluids. Drink prune juice and/or each dried prunes  If the first 3 don't work start with additional solutions Take Colace - an over-the-counter stool softener Take Senokot - an over-the-counter laxative Take Miralax - a stronger over-the-counter laxative   Increase activity slowly   Complete by: As directed       Allergies as of 02/03/2019   No Known Allergies     Medication List    TAKE these medications   acetaminophen 500 MG tablet Commonly known as: TYLENOL Take 2 tablets (1,000 mg total) by mouth every 8 (eight) hours for 14 days.   allopurinol 100 MG tablet Commonly known as: ZYLOPRIM Take 100 mg by mouth daily.   celecoxib 100 MG capsule Commonly known as: CeleBREX Take 1 capsule (100 mg total) by mouth 2 (two) times daily.   dorzolamide 2 % ophthalmic solution Commonly known as: TRUSOPT Place 1 drop into both eyes 2 (two) times daily.   latanoprost 0.005 % ophthalmic solution Commonly known as: XALATAN Apply 1 drop to eye at bedtime.   losartan 100 MG tablet Commonly known as: COZAAR Take 100 mg by mouth daily.   magnesium oxide 400 MG tablet Commonly known as: MAG-OX  Take 400 mg by mouth daily.   Multi-Vitamins Tabs Take 1 tablet by mouth daily.   niacin 500 MG tablet Take 500 mg by mouth daily.   Omega-3 1000 MG Caps Take 1,000 mg by mouth daily.   ondansetron 4 MG tablet Commonly known as: Zofran Take 1 tablet (4 mg total) by mouth every 8 (eight) hours as needed for up to 7 days for nausea or vomiting.   oxyCODONE 5 MG immediate release tablet Commonly known as: Oxy IR/ROXICODONE Take 1-2 pills every 6 hrs as needed for pain, no more than 6 per day   Potassium 99 MG Tabs Take 99 mg by mouth daily.   rosuvastatin 10 MG tablet Commonly known as: CRESTOR Take 10 mg by mouth daily.

## 2019-02-03 NOTE — Progress Notes (Signed)
   02/03/19 1000  OT Visit Information  Last OT Received On 02/03/19  Assistance Needed +1  History of Present Illness s/p R RTSA  Precautions  Precautions Shoulder  Type of Shoulder Precautions sling on all times except bathing/dressing and elbow ROM.  No shoulder ROM.  May move elbow to fingers  Shoulder Interventions Shoulder abduction pillow  Precaution Booklet Issued Yes (comment)  Pain Assessment  Pain Assessment No/denies pain  Cognition  Arousal/Alertness Awake/alert  Behavior During Therapy WFL for tasks assessed/performed  Overall Cognitive Status Within Functional Limits for tasks assessed  Upper Extremity Assessment  Upper Extremity Assessment RUE deficits/detail  RUE Deficits / Details now able to move partially through all movements; thumb still with least movement  ADL  Toilet Transfer Minimal assistance;Ambulation;Comfort height toilet  General ADL Comments daughter present for family education. She has worn an abductor sling in the past and verbalizes comfort with it. Removed and donned again to show and also showed how to adjust straps, if needed. Reviewed shoulder protocol and worked through elbow to finger ROM  Restrictions  RUE Weight Bearing NWB  General Comments  General comments (skin integrity, edema, etc.) daughter will have someone with pt 24/7.  Recommended 3:1 if desired for night use. Pt needs min A for transfers.  Also recommended she sponge bathe 2* tub.  Son works for [employer]. Pt would benefit from tub bench for safety  OT - End of Session  Activity Tolerance Patient tolerated treatment well  Patient left in chair;with call bell/phone within reach;with chair alarm set  Nurse Communication  (pt ready for d/c)  OT Assessment/Plan  Follow Up Recommendations Supervision/Assistance - 24 hour;Follow surgeon's recommendation for DC plan and follow-up therapies  OT Equipment None recommended by OT (son works for med [employer])  AM-PAC OT "6 Clicks" Daily  Activity Outcome Measure (Version 2)  Help from another person eating meals? 3  Help from another person taking care of personal grooming? 3  Help from another person toileting, which includes using toliet, bedpan, or urinal? 2  Help from another person bathing (including washing, rinsing, drying)? 2  Help from another person to put on and taking off regular upper body clothing? 2  Help from another person to put on and taking off regular lower body clothing? 2  6 Click Score 14  OT Goal Progression  Progress towards OT goals  (family education completed)  OT Time Calculation  OT Start Time (ACUTE ONLY) 1011  OT Stop Time (ACUTE ONLY) 1031  OT Time Calculation (min) 20 min  OT General Charges  $OT Visit 1 Visit  OT Treatments  $Self Care/Home Management  8-22 mins  Pt without pain; block in effect Lesle Chris, OTR/L Acute Rehabilitation Services [phone] [facility] pager [phone] office 02/03/2019

## 2019-02-03 NOTE — Evaluation (Signed)
Occupational Therapy Evaluation Patient Details Name: Molly Ortega MRN: [id number] DOB: 26-May-1931 Today's Date: 02/03/2019    History of Present Illness s/p R RTSA   Clinical Impression   This 85 year old female was admitted for the above sx. Completed education with her and handouts given. Pt is HOH and hearing aide batteries were dead.  Had pt verbalize back the key points. Will complete family education with daughter to make sure that she feels comfortable donning/doffing abductor sling and assisting her mother with adls without moving her shoulder    Follow Up Recommendations  Supervision/Assistance - 24 hour;Follow surgeon's recommendation for DC plan and follow-up therapies    Equipment Recommendations  None recommended by OT    Recommendations for Other Services       Precautions / Restrictions Precautions Precautions: Shoulder Type of Shoulder Precautions: sling on all times except bathing/dressing and elbow ROM.  No shoulder ROM.  May move elbow to fingers Shoulder Interventions: Shoulder abduction pillow Precaution Booklet Issued: Yes (comment) Restrictions Weight Bearing Restrictions: Yes RUE Weight Bearing: Non weight bearing      Mobility Bed Mobility               General bed mobility comments: min A for OOB  Transfers Overall transfer level: Needs assistance Equipment used: None Transfers: Sit to/from [facility] Sit to Stand: Min guard Stand pivot transfers: Min assist;Min guard       General transfer comment: pt a little unsteady but no LOB. She reports she has a cane at home and will have 24/7    Balance Overall balance assessment: Mild deficits observed, not formally tested                                         ADL either performed or assessed with clinical judgement   ADL Overall ADL's : Needs assistance/impaired Eating/Feeding: Set up   Grooming: Minimal assistance   Upper Body Bathing:  Moderate assistance   Lower Body Bathing: Moderate assistance   Upper Body Dressing : Maximal assistance   Lower Body Dressing: Maximal assistance   Toilet Transfer: Minimal assistance             General ADL Comments: assisted with adl. Educated on shoulder precautions, had her repeat key points and gave her handout, which was reviewed. Pt HOH and hearing aide batteries dead     Vision         Perception     Praxis      Pertinent Vitals/Pain Pain Assessment: No/denies pain(block in effect)     Hand Dominance Right   Extremity/Trunk Assessment Upper Extremity Assessment Upper Extremity Assessment: RUE deficits/detail(immobilized block in effect)           Communication Communication Communication: No difficulties   Cognition Arousal/Alertness: Awake/alert Behavior During Therapy: WFL for tasks assessed/performed Overall Cognitive Status: Within Functional Limits for tasks assessed                                     General Comments       Exercises     Shoulder Instructions      Home Living Family/patient expects to be discharged to:: Private residence Living Arrangements: Children Available Help at Discharge: Family  Bathroom Shower/Tub: Occupational psychologist: Handicapped height     Home Equipment: Environmental consultant - 4 wheels;Cane - single point;Shower seat          Prior Functioning/Environment Level of Independence: Independent with assistive device(s)                 OT Problem List: Decreased strength;Decreased activity tolerance;Impaired balance (sitting and/or standing);Decreased knowledge of precautions;Impaired UE functional use      OT Treatment/Interventions: Self-care/ADL training;Therapeutic activities;Patient/family education    OT Goals(Current goals can be found in the care plan section) Acute Rehab OT Goals Patient Stated Goal: none stated OT Goal Formulation: With patient Time  For Goal Achievement: 02/10/19 Potential to Achieve Goals: Good  OT Frequency: Min 2X/week   Barriers to D/C:            Co-evaluation              AM-PAC OT "6 Clicks" Daily Activity     Outcome Measure Help from another person eating meals?: A Little Help from another person taking care of personal grooming?: A Little Help from another person toileting, which includes using toliet, bedpan, or urinal?: A Lot Help from another person bathing (including washing, rinsing, drying)?: A Lot Help from another person to put on and taking off regular upper body clothing?: A Lot Help from another person to put on and taking off regular lower body clothing?: A Lot 6 Click Score: 14   End of Session    Activity Tolerance: Patient tolerated treatment well Patient left: in chair;with call bell/phone within reach;with chair alarm set  OT Visit Diagnosis: Muscle weakness (generalized) (M62.81)                Time: 0488-8916 OT Time Calculation (min): 42 min Charges:  OT General Charges $OT Visit: 1 Visit OT Evaluation $OT Eval Low Complexity: 1 Low OT Treatments $Self Care/Home Management : 23-37 mins  Lesle Chris, OTR/L Acute Rehabilitation Services [phone] [facility] pager [phone] office 02/03/2019  [place] 02/03/2019, 9:47 AM

## 2019-02-03 NOTE — Plan of Care (Signed)
  Problem: Education: Goal: Knowledge of the prescribed therapeutic regimen will improve Outcome: Progressing   Problem: Pain Management: Goal: Pain level will decrease with appropriate interventions Outcome: Progressing   Problem: Education: Goal: Knowledge of General Education information will improve Description: Including pain rating scale, medication(s)/side effects and non-pharmacologic comfort measures Outcome: Progressing   Problem: Health Behavior/Discharge Planning: Goal: Ability to manage health-related needs will improve Outcome: Progressing

## 2019-02-07 ENCOUNTER — Encounter (HOSPITAL_COMMUNITY): Payer: Self-pay | Admitting: Orthopaedic Surgery

## 2019-02-11 DIAGNOSIS — M19011 Primary osteoarthritis, right shoulder: Secondary | ICD-10-CM | POA: Diagnosis not present

## 2019-02-16 DIAGNOSIS — S31000A Unspecified open wound of lower back and pelvis without penetration into retroperitoneum, initial encounter: Secondary | ICD-10-CM | POA: Diagnosis not present

## 2019-02-16 DIAGNOSIS — T7840XA Allergy, unspecified, initial encounter: Secondary | ICD-10-CM | POA: Diagnosis not present

## 2019-02-21 DIAGNOSIS — H26491 Other secondary cataract, right eye: Secondary | ICD-10-CM | POA: Diagnosis not present

## 2019-02-21 DIAGNOSIS — Z961 Presence of intraocular lens: Secondary | ICD-10-CM | POA: Diagnosis not present

## 2019-02-21 DIAGNOSIS — H401132 Primary open-angle glaucoma, bilateral, moderate stage: Secondary | ICD-10-CM | POA: Diagnosis not present

## 2019-02-22 DIAGNOSIS — S31000A Unspecified open wound of lower back and pelvis without penetration into retroperitoneum, initial encounter: Secondary | ICD-10-CM | POA: Diagnosis not present

## 2019-02-22 DIAGNOSIS — E785 Hyperlipidemia, unspecified: Secondary | ICD-10-CM | POA: Diagnosis not present

## 2019-02-22 DIAGNOSIS — R945 Abnormal results of liver function studies: Secondary | ICD-10-CM | POA: Diagnosis not present

## 2019-02-28 DIAGNOSIS — L570 Actinic keratosis: Secondary | ICD-10-CM | POA: Diagnosis not present

## 2019-02-28 DIAGNOSIS — L821 Other seborrheic keratosis: Secondary | ICD-10-CM | POA: Diagnosis not present

## 2019-02-28 DIAGNOSIS — L0390 Cellulitis, unspecified: Secondary | ICD-10-CM | POA: Diagnosis not present

## 2019-02-28 DIAGNOSIS — L578 Other skin changes due to chronic exposure to nonionizing radiation: Secondary | ICD-10-CM | POA: Diagnosis not present

## 2019-02-28 DIAGNOSIS — L0201 Cutaneous abscess of face: Secondary | ICD-10-CM | POA: Diagnosis not present

## 2019-03-04 DIAGNOSIS — M19011 Primary osteoarthritis, right shoulder: Secondary | ICD-10-CM | POA: Diagnosis not present

## 2019-03-10 DIAGNOSIS — L720 Epidermal cyst: Secondary | ICD-10-CM | POA: Diagnosis not present

## 2019-03-11 DIAGNOSIS — M6281 Muscle weakness (generalized): Secondary | ICD-10-CM | POA: Diagnosis not present

## 2019-03-11 DIAGNOSIS — M19011 Primary osteoarthritis, right shoulder: Secondary | ICD-10-CM | POA: Diagnosis not present

## 2019-03-11 DIAGNOSIS — Z96611 Presence of right artificial shoulder joint: Secondary | ICD-10-CM | POA: Diagnosis not present

## 2019-03-11 DIAGNOSIS — M25611 Stiffness of right shoulder, not elsewhere classified: Secondary | ICD-10-CM | POA: Diagnosis not present

## 2019-03-16 DIAGNOSIS — M6281 Muscle weakness (generalized): Secondary | ICD-10-CM | POA: Diagnosis not present

## 2019-03-16 DIAGNOSIS — M19011 Primary osteoarthritis, right shoulder: Secondary | ICD-10-CM | POA: Diagnosis not present

## 2019-03-16 DIAGNOSIS — M25611 Stiffness of right shoulder, not elsewhere classified: Secondary | ICD-10-CM | POA: Diagnosis not present

## 2019-03-16 DIAGNOSIS — Z96611 Presence of right artificial shoulder joint: Secondary | ICD-10-CM | POA: Diagnosis not present

## 2019-03-17 DIAGNOSIS — M25611 Stiffness of right shoulder, not elsewhere classified: Secondary | ICD-10-CM | POA: Diagnosis not present

## 2019-03-17 DIAGNOSIS — M6281 Muscle weakness (generalized): Secondary | ICD-10-CM | POA: Diagnosis not present

## 2019-03-17 DIAGNOSIS — M19011 Primary osteoarthritis, right shoulder: Secondary | ICD-10-CM | POA: Diagnosis not present

## 2019-03-17 DIAGNOSIS — Z96611 Presence of right artificial shoulder joint: Secondary | ICD-10-CM | POA: Diagnosis not present

## 2019-03-21 DIAGNOSIS — Z96611 Presence of right artificial shoulder joint: Secondary | ICD-10-CM | POA: Diagnosis not present

## 2019-03-21 DIAGNOSIS — M25611 Stiffness of right shoulder, not elsewhere classified: Secondary | ICD-10-CM | POA: Diagnosis not present

## 2019-03-21 DIAGNOSIS — M19011 Primary osteoarthritis, right shoulder: Secondary | ICD-10-CM | POA: Diagnosis not present

## 2019-03-21 DIAGNOSIS — M6281 Muscle weakness (generalized): Secondary | ICD-10-CM | POA: Diagnosis not present

## 2019-03-24 DIAGNOSIS — M25611 Stiffness of right shoulder, not elsewhere classified: Secondary | ICD-10-CM | POA: Diagnosis not present

## 2019-03-24 DIAGNOSIS — M6281 Muscle weakness (generalized): Secondary | ICD-10-CM | POA: Diagnosis not present

## 2019-03-24 DIAGNOSIS — Z96611 Presence of right artificial shoulder joint: Secondary | ICD-10-CM | POA: Diagnosis not present

## 2019-03-24 DIAGNOSIS — M19011 Primary osteoarthritis, right shoulder: Secondary | ICD-10-CM | POA: Diagnosis not present

## 2019-03-30 DIAGNOSIS — Z96611 Presence of right artificial shoulder joint: Secondary | ICD-10-CM | POA: Diagnosis not present

## 2019-03-30 DIAGNOSIS — M6281 Muscle weakness (generalized): Secondary | ICD-10-CM | POA: Diagnosis not present

## 2019-03-30 DIAGNOSIS — M25611 Stiffness of right shoulder, not elsewhere classified: Secondary | ICD-10-CM | POA: Diagnosis not present

## 2019-03-30 DIAGNOSIS — M19011 Primary osteoarthritis, right shoulder: Secondary | ICD-10-CM | POA: Diagnosis not present

## 2019-04-04 DIAGNOSIS — Z96611 Presence of right artificial shoulder joint: Secondary | ICD-10-CM | POA: Diagnosis not present

## 2019-04-04 DIAGNOSIS — M19011 Primary osteoarthritis, right shoulder: Secondary | ICD-10-CM | POA: Diagnosis not present

## 2019-04-04 DIAGNOSIS — M25611 Stiffness of right shoulder, not elsewhere classified: Secondary | ICD-10-CM | POA: Diagnosis not present

## 2019-04-04 DIAGNOSIS — M6281 Muscle weakness (generalized): Secondary | ICD-10-CM | POA: Diagnosis not present

## 2019-04-06 DIAGNOSIS — Z96611 Presence of right artificial shoulder joint: Secondary | ICD-10-CM | POA: Diagnosis not present

## 2019-04-06 DIAGNOSIS — M19011 Primary osteoarthritis, right shoulder: Secondary | ICD-10-CM | POA: Diagnosis not present

## 2019-04-06 DIAGNOSIS — M25611 Stiffness of right shoulder, not elsewhere classified: Secondary | ICD-10-CM | POA: Diagnosis not present

## 2019-04-06 DIAGNOSIS — M6281 Muscle weakness (generalized): Secondary | ICD-10-CM | POA: Diagnosis not present

## 2019-04-11 DIAGNOSIS — M19011 Primary osteoarthritis, right shoulder: Secondary | ICD-10-CM | POA: Diagnosis not present

## 2019-04-11 DIAGNOSIS — M25611 Stiffness of right shoulder, not elsewhere classified: Secondary | ICD-10-CM | POA: Diagnosis not present

## 2019-04-11 DIAGNOSIS — Z96611 Presence of right artificial shoulder joint: Secondary | ICD-10-CM | POA: Diagnosis not present

## 2019-04-11 DIAGNOSIS — M6281 Muscle weakness (generalized): Secondary | ICD-10-CM | POA: Diagnosis not present

## 2019-04-13 DIAGNOSIS — M6281 Muscle weakness (generalized): Secondary | ICD-10-CM | POA: Diagnosis not present

## 2019-04-13 DIAGNOSIS — M25611 Stiffness of right shoulder, not elsewhere classified: Secondary | ICD-10-CM | POA: Diagnosis not present

## 2019-04-13 DIAGNOSIS — M19011 Primary osteoarthritis, right shoulder: Secondary | ICD-10-CM | POA: Diagnosis not present

## 2019-04-13 DIAGNOSIS — Z96611 Presence of right artificial shoulder joint: Secondary | ICD-10-CM | POA: Diagnosis not present

## 2019-04-18 DIAGNOSIS — Z96611 Presence of right artificial shoulder joint: Secondary | ICD-10-CM | POA: Diagnosis not present

## 2019-04-18 DIAGNOSIS — M19011 Primary osteoarthritis, right shoulder: Secondary | ICD-10-CM | POA: Diagnosis not present

## 2019-04-18 DIAGNOSIS — M6281 Muscle weakness (generalized): Secondary | ICD-10-CM | POA: Diagnosis not present

## 2019-04-18 DIAGNOSIS — M25611 Stiffness of right shoulder, not elsewhere classified: Secondary | ICD-10-CM | POA: Diagnosis not present

## 2019-04-20 DIAGNOSIS — Z96611 Presence of right artificial shoulder joint: Secondary | ICD-10-CM | POA: Diagnosis not present

## 2019-04-20 DIAGNOSIS — M6281 Muscle weakness (generalized): Secondary | ICD-10-CM | POA: Diagnosis not present

## 2019-04-20 DIAGNOSIS — M25611 Stiffness of right shoulder, not elsewhere classified: Secondary | ICD-10-CM | POA: Diagnosis not present

## 2019-04-20 DIAGNOSIS — M19011 Primary osteoarthritis, right shoulder: Secondary | ICD-10-CM | POA: Diagnosis not present

## 2019-04-27 DIAGNOSIS — M19011 Primary osteoarthritis, right shoulder: Secondary | ICD-10-CM | POA: Diagnosis not present

## 2019-04-27 DIAGNOSIS — M6281 Muscle weakness (generalized): Secondary | ICD-10-CM | POA: Diagnosis not present

## 2019-04-27 DIAGNOSIS — M25611 Stiffness of right shoulder, not elsewhere classified: Secondary | ICD-10-CM | POA: Diagnosis not present

## 2019-04-27 DIAGNOSIS — Z96611 Presence of right artificial shoulder joint: Secondary | ICD-10-CM | POA: Diagnosis not present

## 2019-04-29 DIAGNOSIS — M6281 Muscle weakness (generalized): Secondary | ICD-10-CM | POA: Diagnosis not present

## 2019-04-29 DIAGNOSIS — Z96611 Presence of right artificial shoulder joint: Secondary | ICD-10-CM | POA: Diagnosis not present

## 2019-04-29 DIAGNOSIS — M25611 Stiffness of right shoulder, not elsewhere classified: Secondary | ICD-10-CM | POA: Diagnosis not present

## 2019-04-29 DIAGNOSIS — M19011 Primary osteoarthritis, right shoulder: Secondary | ICD-10-CM | POA: Diagnosis not present

## 2019-05-04 DIAGNOSIS — M6281 Muscle weakness (generalized): Secondary | ICD-10-CM | POA: Diagnosis not present

## 2019-05-04 DIAGNOSIS — M25611 Stiffness of right shoulder, not elsewhere classified: Secondary | ICD-10-CM | POA: Diagnosis not present

## 2019-05-04 DIAGNOSIS — Z96611 Presence of right artificial shoulder joint: Secondary | ICD-10-CM | POA: Diagnosis not present

## 2019-05-04 DIAGNOSIS — M19011 Primary osteoarthritis, right shoulder: Secondary | ICD-10-CM | POA: Diagnosis not present

## 2019-05-10 DIAGNOSIS — M25611 Stiffness of right shoulder, not elsewhere classified: Secondary | ICD-10-CM | POA: Diagnosis not present

## 2019-05-10 DIAGNOSIS — Z96611 Presence of right artificial shoulder joint: Secondary | ICD-10-CM | POA: Diagnosis not present

## 2019-05-10 DIAGNOSIS — M6281 Muscle weakness (generalized): Secondary | ICD-10-CM | POA: Diagnosis not present

## 2019-05-10 DIAGNOSIS — M19011 Primary osteoarthritis, right shoulder: Secondary | ICD-10-CM | POA: Diagnosis not present

## 2019-05-11 DIAGNOSIS — Z6830 Body mass index (BMI) 30.0-30.9, adult: Secondary | ICD-10-CM | POA: Diagnosis not present

## 2019-05-11 DIAGNOSIS — N959 Unspecified menopausal and perimenopausal disorder: Secondary | ICD-10-CM | POA: Diagnosis not present

## 2019-05-11 DIAGNOSIS — Z1231 Encounter for screening mammogram for malignant neoplasm of breast: Secondary | ICD-10-CM | POA: Diagnosis not present

## 2019-05-11 DIAGNOSIS — Z1331 Encounter for screening for depression: Secondary | ICD-10-CM | POA: Diagnosis not present

## 2019-05-11 DIAGNOSIS — Z0000 Encounter for general adult medical examination without abnormal findings: Secondary | ICD-10-CM | POA: Diagnosis not present

## 2019-05-11 DIAGNOSIS — Z9181 History of falling: Secondary | ICD-10-CM | POA: Diagnosis not present

## 2019-05-11 DIAGNOSIS — E785 Hyperlipidemia, unspecified: Secondary | ICD-10-CM | POA: Diagnosis not present

## 2019-05-18 DIAGNOSIS — M6281 Muscle weakness (generalized): Secondary | ICD-10-CM | POA: Diagnosis not present

## 2019-05-18 DIAGNOSIS — Z96611 Presence of right artificial shoulder joint: Secondary | ICD-10-CM | POA: Diagnosis not present

## 2019-05-18 DIAGNOSIS — M19011 Primary osteoarthritis, right shoulder: Secondary | ICD-10-CM | POA: Diagnosis not present

## 2019-05-18 DIAGNOSIS — M25611 Stiffness of right shoulder, not elsewhere classified: Secondary | ICD-10-CM | POA: Diagnosis not present

## 2019-06-01 DIAGNOSIS — M109 Gout, unspecified: Secondary | ICD-10-CM | POA: Diagnosis not present

## 2019-06-01 DIAGNOSIS — I129 Hypertensive chronic kidney disease with stage 1 through stage 4 chronic kidney disease, or unspecified chronic kidney disease: Secondary | ICD-10-CM | POA: Diagnosis not present

## 2019-06-01 DIAGNOSIS — E785 Hyperlipidemia, unspecified: Secondary | ICD-10-CM | POA: Diagnosis not present

## 2019-06-01 DIAGNOSIS — N183 Chronic kidney disease, stage 3 unspecified: Secondary | ICD-10-CM | POA: Diagnosis not present

## 2019-06-01 DIAGNOSIS — R7303 Prediabetes: Secondary | ICD-10-CM | POA: Diagnosis not present

## 2019-06-01 DIAGNOSIS — R197 Diarrhea, unspecified: Secondary | ICD-10-CM | POA: Diagnosis not present

## 2019-06-01 DIAGNOSIS — Z23 Encounter for immunization: Secondary | ICD-10-CM | POA: Diagnosis not present

## 2019-06-02 DIAGNOSIS — H903 Sensorineural hearing loss, bilateral: Secondary | ICD-10-CM | POA: Diagnosis not present

## 2019-06-06 DIAGNOSIS — R197 Diarrhea, unspecified: Secondary | ICD-10-CM | POA: Diagnosis not present

## 2019-06-17 DIAGNOSIS — M810 Age-related osteoporosis without current pathological fracture: Secondary | ICD-10-CM | POA: Diagnosis not present

## 2019-06-23 DIAGNOSIS — H26491 Other secondary cataract, right eye: Secondary | ICD-10-CM | POA: Diagnosis not present

## 2019-06-23 DIAGNOSIS — Z961 Presence of intraocular lens: Secondary | ICD-10-CM | POA: Diagnosis not present

## 2019-06-23 DIAGNOSIS — H401132 Primary open-angle glaucoma, bilateral, moderate stage: Secondary | ICD-10-CM | POA: Diagnosis not present

## 2019-06-24 DIAGNOSIS — L720 Epidermal cyst: Secondary | ICD-10-CM | POA: Diagnosis not present

## 2019-06-24 DIAGNOSIS — L570 Actinic keratosis: Secondary | ICD-10-CM | POA: Diagnosis not present

## 2019-07-14 ENCOUNTER — Ambulatory Visit: Payer: 59 | Admitting: Cardiology

## 2019-07-15 ENCOUNTER — Other Ambulatory Visit: Payer: Self-pay

## 2019-07-15 ENCOUNTER — Encounter: Payer: Self-pay | Admitting: Cardiology

## 2019-07-15 ENCOUNTER — Ambulatory Visit (INDEPENDENT_AMBULATORY_CARE_PROVIDER_SITE_OTHER): Payer: Medicare Other | Admitting: Cardiology

## 2019-07-15 VITALS — BP 120/62 | HR 52 | Ht 61.0 in | Wt 164.0 lb

## 2019-07-15 DIAGNOSIS — I10 Essential (primary) hypertension: Secondary | ICD-10-CM

## 2019-07-15 DIAGNOSIS — E782 Mixed hyperlipidemia: Secondary | ICD-10-CM

## 2019-07-15 DIAGNOSIS — I447 Left bundle-branch block, unspecified: Secondary | ICD-10-CM

## 2019-07-15 NOTE — Patient Instructions (Signed)

## 2019-07-15 NOTE — Progress Notes (Signed)
Cardiology Office Note:    Date:  07/15/2019   ID:  BONNYE SESTO, DOB 1931-06-20, MRN [id number]  PCP:  Renaldo Reel, PA  Cardiologist:  Jenne Campus, MD    Referring MD: Renaldo Reel, [place]   Chief Complaint  Patient presents with  . Follow-up  Doing well  History of Present Illness:    SHARNITA HENDRICH is a 85 y.o. female with past medical history significant for left bundle branch block which was incidental discovery, hyperlipidemia, hypertension comes today to my office for follow-up.  She did have a right shoulder surgery replacement.  Did well with this and recovering nicely.  Very happy and satisfied the way she feels.  She loves cooking and cooks a lot.  Past Medical History:  Diagnosis Date  . Arthritis   . Chronic cough    cronic , dry ; patient states " ive had this cough for almost all my life, my old doctor, Dr Marcello Moores thought it might be from some asthma or allergies but never said for sure"  . Chronic kidney disease (CKD), stage III (moderate)   . Chronic pain syndrome 01/06/2018  . Glaucoma   . Hepatic steatosis   . Hyperlipidemia 05/19/2018  . Hypertension 05/19/2018  . Left bundle branch block (LBBB) on electrocardiogram   . Mild aortic valve stenosis   . Paresthesias 09/25/2017  . Peripheral neuropathy   . Pre-diabetes   . Primary open angle glaucoma (POAG) of both eyes, moderate stage 11/30/2017  . Pseudophakia of both eyes 11/30/2017  . Rosacea     Past Surgical History:  Procedure Laterality Date  . CARPAL TUNNEL RELEASE Right   . CHOLECYSTECTOMY  2015  . DILATION AND CURETTAGE, DIAGNOSTIC / THERAPEUTIC  1960s  . EYE SURGERY Bilateral 08/24/2004   cataract Basic no lensx  . REVERSE SHOULDER ARTHROPLASTY Right 02/02/2019   Procedure: REVERSE SHOULDER ARTHROPLASTY;  Surgeon: Hiram Gash, MD;  Location: [facility];  Service: Orthopedics;  Laterality: Right;    Current Medications: Current Meds  Medication Sig  . allopurinol (ZYLOPRIM) 100 MG tablet  Take 100 mg by mouth daily.   . celecoxib (CELEBREX) 100 MG capsule Take 1 capsule (100 mg total) by mouth 2 (two) times daily.  . dorzolamide (TRUSOPT) 2 % ophthalmic solution Place 1 drop into both eyes 2 (two) times daily.   Marland Kitchen icosapent Ethyl (VASCEPA) 1 g capsule Take 2 g by mouth 2 (two) times daily.  Marland Kitchen latanoprost (XALATAN) 0.005 % ophthalmic solution Apply 1 drop to eye at bedtime.   Marland Kitchen losartan (COZAAR) 100 MG tablet Take 100 mg by mouth daily.   . magnesium oxide (MAG-OX) 400 MG tablet Take 400 mg by mouth daily.   . Multiple Vitamin (MULTI-VITAMINS) TABS Take 1 tablet by mouth daily.   . niacin 500 MG tablet Take 500 mg by mouth daily.   . Omega-3 1000 MG CAPS Take 1,000 mg by mouth daily.   . Potassium 99 MG TABS Take 99 mg by mouth daily.   . rosuvastatin (CRESTOR) 10 MG tablet Take 10 mg by mouth daily.      Allergies:   Patient has no known allergies.   Social History   Socioeconomic History  . Marital status: Widowed    Spouse name: Not on file  . Number of children: Not on file  . Years of education: Not on file  . Highest education level: Not on file  Occupational History  . Not on file  Social  Needs  . Financial resource strain: Not on file  . Food insecurity    Worry: Not on file    Inability: Not on file  . Transportation needs    Medical: Not on file    Non-medical: Not on file  Tobacco Use  . Smoking status: Former Research scientist (life sciences)  . Smokeless tobacco: Never Used  Substance and Sexual Activity  . Alcohol use: Not Currently  . Drug use: Never  . Sexual activity: Not on file  Lifestyle  . Physical activity    Days per week: Not on file    Minutes per session: Not on file  . Stress: Not on file  Relationships  . Social Herbalist on phone: Not on file    Gets together: Not on file    Attends religious service: Not on file    Active member of club or organization: Not on file    Attends meetings of clubs or organizations: Not on file     Relationship status: Not on file  Other Topics Concern  . Not on file  Social History Narrative  . Not on file     Family History: The patient's family history includes Diabetes in her father; Heart attack in her father and mother; Lung cancer in her mother. ROS:   Please see the history of present illness.    All 14 point review of systems negative except as described per history of present illness  EKGs/Labs/Other Studies Reviewed:      Recent Labs: 01/28/2019: ALT 19; BUN 24; Creatinine, Ser 0.98; Hemoglobin 12.3; Platelets 182; Potassium 3.7; Sodium 142  Recent Lipid Panel No results found for: CHOL, TRIG, HDL, CHOLHDL, VLDL, LDLCALC, LDLDIRECT  Physical Exam:    VS:  BP 120/62   Pulse (!) 52   Ht 5\' 1"  (1.549 m)   Wt 164 lb (74.4 kg)   SpO2 91%   BMI 30.99 kg/m     Wt Readings from Last 3 Encounters:  07/15/19 164 lb (74.4 kg)  02/02/19 160 lb (72.6 kg)  10/01/18 165 lb 9.6 oz (75.1 kg)     GEN:  Well nourished, well developed in no acute distress HEENT: Normal NECK: No JVD; No carotid bruits LYMPHATICS: No lymphadenopathy CARDIAC: RRR, no murmurs, no rubs, no gallops RESPIRATORY:  Clear to auscultation without rales, wheezing or rhonchi  ABDOMEN: Soft, non-tender, non-distended MUSCULOSKELETAL:  No edema; No deformity  SKIN: Warm and dry LOWER EXTREMITIES: no swelling NEUROLOGIC:  Alert and oriented x 3 PSYCHIATRIC:  Normal affect   ASSESSMENT:    1. Essential hypertension   2. Left bundle branch block   3. Mixed hyperlipidemia    PLAN:    In order of problems listed above:  1. Essential hypertension blood pressure controlled continue present management 2. Left bundle branch block stable next year we will repeat echocardiogram 3. Mixed dyslipidemia on Crestor which I will continue.   Medication Adjustments/Labs and Tests Ordered: Current medicines are reviewed at length with the patient today.  Concerns regarding medicines are outlined above.   No orders of the defined types were placed in this encounter.  Medication changes: No orders of the defined types were placed in this encounter.   Signed, Park Liter, MD, [facility] 07/15/2019 11:50 AM    [place]

## 2019-07-25 DIAGNOSIS — Z961 Presence of intraocular lens: Secondary | ICD-10-CM | POA: Diagnosis not present

## 2019-07-25 DIAGNOSIS — H401132 Primary open-angle glaucoma, bilateral, moderate stage: Secondary | ICD-10-CM | POA: Diagnosis not present

## 2019-07-25 DIAGNOSIS — H26491 Other secondary cataract, right eye: Secondary | ICD-10-CM | POA: Diagnosis not present

## 2019-10-19 DIAGNOSIS — G894 Chronic pain syndrome: Secondary | ICD-10-CM | POA: Diagnosis not present

## 2019-10-19 DIAGNOSIS — R202 Paresthesia of skin: Secondary | ICD-10-CM | POA: Diagnosis not present

## 2019-10-19 DIAGNOSIS — M48062 Spinal stenosis, lumbar region with neurogenic claudication: Secondary | ICD-10-CM | POA: Diagnosis not present

## 2019-11-02 DIAGNOSIS — M48062 Spinal stenosis, lumbar region with neurogenic claudication: Secondary | ICD-10-CM | POA: Diagnosis not present

## 2019-11-23 DIAGNOSIS — Z961 Presence of intraocular lens: Secondary | ICD-10-CM | POA: Diagnosis not present

## 2019-11-23 DIAGNOSIS — H401132 Primary open-angle glaucoma, bilateral, moderate stage: Secondary | ICD-10-CM | POA: Diagnosis not present

## 2019-11-23 DIAGNOSIS — H26491 Other secondary cataract, right eye: Secondary | ICD-10-CM | POA: Diagnosis not present

## 2019-11-28 DIAGNOSIS — N183 Chronic kidney disease, stage 3 unspecified: Secondary | ICD-10-CM | POA: Diagnosis not present

## 2019-11-28 DIAGNOSIS — R300 Dysuria: Secondary | ICD-10-CM | POA: Diagnosis not present

## 2019-11-28 DIAGNOSIS — M109 Gout, unspecified: Secondary | ICD-10-CM | POA: Diagnosis not present

## 2019-11-28 DIAGNOSIS — E785 Hyperlipidemia, unspecified: Secondary | ICD-10-CM | POA: Diagnosis not present

## 2019-11-28 DIAGNOSIS — R7303 Prediabetes: Secondary | ICD-10-CM | POA: Diagnosis not present

## 2019-11-28 DIAGNOSIS — I129 Hypertensive chronic kidney disease with stage 1 through stage 4 chronic kidney disease, or unspecified chronic kidney disease: Secondary | ICD-10-CM | POA: Diagnosis not present

## 2019-12-12 DIAGNOSIS — Z6829 Body mass index (BMI) 29.0-29.9, adult: Secondary | ICD-10-CM | POA: Diagnosis not present

## 2019-12-13 DIAGNOSIS — N390 Urinary tract infection, site not specified: Secondary | ICD-10-CM | POA: Diagnosis not present

## 2019-12-16 DIAGNOSIS — M810 Age-related osteoporosis without current pathological fracture: Secondary | ICD-10-CM | POA: Diagnosis not present

## 2019-12-23 DIAGNOSIS — N814 Uterovaginal prolapse, unspecified: Secondary | ICD-10-CM | POA: Diagnosis not present

## 2020-01-16 DIAGNOSIS — M48062 Spinal stenosis, lumbar region with neurogenic claudication: Secondary | ICD-10-CM | POA: Diagnosis not present

## 2020-02-08 DIAGNOSIS — N8111 Cystocele, midline: Secondary | ICD-10-CM | POA: Diagnosis not present

## 2020-02-08 DIAGNOSIS — N3281 Overactive bladder: Secondary | ICD-10-CM | POA: Diagnosis not present

## 2020-02-08 DIAGNOSIS — N814 Uterovaginal prolapse, unspecified: Secondary | ICD-10-CM | POA: Diagnosis not present

## 2020-02-08 DIAGNOSIS — Z4689 Encounter for fitting and adjustment of other specified devices: Secondary | ICD-10-CM | POA: Diagnosis not present

## 2020-02-08 DIAGNOSIS — R54 Age-related physical debility: Secondary | ICD-10-CM | POA: Diagnosis not present

## 2020-02-08 DIAGNOSIS — L304 Erythema intertrigo: Secondary | ICD-10-CM | POA: Diagnosis not present

## 2020-02-08 DIAGNOSIS — N39490 Overflow incontinence: Secondary | ICD-10-CM | POA: Diagnosis not present

## 2020-02-17 DIAGNOSIS — G629 Polyneuropathy, unspecified: Secondary | ICD-10-CM

## 2020-02-17 HISTORY — DX: Polyneuropathy, unspecified: G62.9

## 2020-03-06 ENCOUNTER — Ambulatory Visit (INDEPENDENT_AMBULATORY_CARE_PROVIDER_SITE_OTHER): Payer: Medicare Other | Admitting: Cardiology

## 2020-03-06 ENCOUNTER — Other Ambulatory Visit: Payer: Self-pay

## 2020-03-06 ENCOUNTER — Encounter: Payer: Self-pay | Admitting: Cardiology

## 2020-03-06 VITALS — BP 138/68 | HR 63 | Ht 61.0 in | Wt 157.8 lb

## 2020-03-06 DIAGNOSIS — R001 Bradycardia, unspecified: Secondary | ICD-10-CM

## 2020-03-06 DIAGNOSIS — I10 Essential (primary) hypertension: Secondary | ICD-10-CM

## 2020-03-06 DIAGNOSIS — I447 Left bundle-branch block, unspecified: Secondary | ICD-10-CM

## 2020-03-06 DIAGNOSIS — E782 Mixed hyperlipidemia: Secondary | ICD-10-CM | POA: Diagnosis not present

## 2020-03-06 HISTORY — DX: Bradycardia, unspecified: R00.1

## 2020-03-06 NOTE — Progress Notes (Signed)
Cardiology Office Note:    Date:  03/06/2020   ID:  Molly Ortega, DOB 02/15/1931, MRN [id number]  PCP:  Molly Reel, PA  Cardiologist:  Molly Campus, MD    Referring MD: Molly Ortega   No chief complaint on file. Doing fine  History of Present Illness:    Molly Ortega is a 86 y.o. female with past medical history significant for left bundle branch block, essential hypertension, dyslipidemia comes today to my office for follow-up.  Previously she had echocardiogram done which showed preserved left ventricle ejection fraction, she did have mild aortic stenosis.  She overall does well complain of being weak tired exhausted sometimes.  Her daughter tells me however that she is the same like she was before she is hard of hearing, therefore, discussion with her is somewhat difficult.  Denies have any chest pain, tightness, pressure, burning in the chest.  She loves cooking and she cooks a lot.  Past Medical History:  Diagnosis Date   Arthritis    Chronic cough    cronic , dry ; patient states " ive had this cough for almost all my life, my old doctor, Molly Ortega thought it might be from some asthma or allergies but never said for sure"   Chronic kidney disease (CKD), stage III (moderate)    Chronic pain syndrome 01/06/2018   Glaucoma    Hepatic steatosis    Hyperlipidemia 05/19/2018   Hypertension 05/19/2018   Left bundle branch block (LBBB) on electrocardiogram    Mild aortic valve stenosis    Paresthesias 09/25/2017   Peripheral neuropathy    Pre-diabetes    Primary open angle glaucoma (POAG) of both eyes, moderate stage 11/30/2017   Pseudophakia of both eyes 11/30/2017   Rosacea     Past Surgical History:  Procedure Laterality Date   CARPAL TUNNEL RELEASE Right    CHOLECYSTECTOMY  2015   DILATION AND CURETTAGE, DIAGNOSTIC / THERAPEUTIC  1960s   EYE SURGERY Bilateral 08/24/2004   cataract Basic no lensx   REVERSE SHOULDER ARTHROPLASTY Right  02/02/2019   Procedure: REVERSE SHOULDER ARTHROPLASTY;  Surgeon: Molly Gash, MD;  Location: [facility];  Service: Orthopedics;  Laterality: Right;    Current Medications: Current Meds  Medication Sig   allopurinol (ZYLOPRIM) 100 MG tablet Take 100 mg by mouth daily.    Brinzolamide-Brimonidine (SIMBRINZA) 1-0.2 % SUSP INSTILL 1 DROP INTO EACH EYE TWICE DAILY   hydrochlorothiazide (HYDRODIURIL) 25 MG tablet Take 25 mg by mouth daily.   icosapent Ethyl (VASCEPA) 1 g capsule Take 2 g by mouth 2 (two) times daily.   latanoprost (XALATAN) 0.005 % ophthalmic solution Apply 1 drop to eye at bedtime.    losartan (COZAAR) 100 MG tablet Take 100 mg by mouth daily.    Multiple Vitamin (MULTI-VITAMINS) TABS Take 1 tablet by mouth daily.    niacin 100 MG tablet Take 100 mg by mouth daily.    Omega-3 1000 MG CAPS Take 1,000 mg by mouth daily.    rosuvastatin (CRESTOR) 10 MG tablet Take 10 mg by mouth daily.      Allergies:   Patient has no known allergies.   Social History   Socioeconomic History   Marital status: Widowed    Spouse name: Not on file   Number of children: Not on file   Years of education: Not on file   Highest education level: Not on file  Occupational History   Not on file  Tobacco Use  Smoking status: Former Smoker   Smokeless tobacco: Never Used  Scientific laboratory technician Use: Never used  Substance and Sexual Activity   Alcohol use: Not Currently   Drug use: Never   Sexual activity: Not on file  Other Topics Concern   Not on file  Social History Narrative   Not on file   Social Determinants of Health   Financial Resource Strain:    Difficulty of Paying Living Expenses:   Food Insecurity:    Worried About Charity fundraiser in the Last Year:    Arboriculturist in the Last Year:   Transportation Needs:    Film/video editor (Medical):    Lack of Transportation (Non-Medical):   Physical Activity:    Days of Exercise per Week:     Minutes of Exercise per Session:   Stress:    Feeling of Stress :   Social Connections:    Frequency of Communication with Friends and Family:    Frequency of Social Gatherings with Friends and Family:    Attends Religious Services:    Active Member of Clubs or Organizations:    Attends Music therapist:    Marital Status:      Family History: The patient's family history includes Diabetes in her father; Heart attack in her father and mother; Lung cancer in her mother. ROS:   Please see the history of present illness.    All 14 point review of systems negative except as described per history of present illness  EKGs/Labs/Other Studies Reviewed:      Recent Labs: No results found for requested labs within last 8760 hours.  Recent Lipid Panel No results found for: CHOL, TRIG, HDL, CHOLHDL, VLDL, LDLCALC, LDLDIRECT  Physical Exam:    VS:  BP 138/68 (BP Location: Left Arm, Patient Position: Sitting, Cuff Size: Normal)    Pulse 63    Ht 5\' 1"  (1.549 m)    Wt 157 lb 12.8 oz (71.6 kg)    SpO2 96%    BMI 29.82 kg/m     Wt Readings from Last 3 Encounters:  03/06/20 157 lb 12.8 oz (71.6 kg)  07/15/19 164 lb (74.4 kg)  02/02/19 160 lb (72.6 kg)     GEN:  Well nourished, well developed in no acute distress HEENT: Normal NECK: No JVD; No carotid bruits LYMPHATICS: No lymphadenopathy CARDIAC: RRR, systolic ejection murmur grade 1/6 to 2/6 best heard right upper portion of the sternum, no rubs, no gallops RESPIRATORY:  Clear to auscultation without rales, wheezing or rhonchi  ABDOMEN: Soft, non-tender, non-distended MUSCULOSKELETAL:  No edema; No deformity  SKIN: Warm and dry LOWER EXTREMITIES: no swelling NEUROLOGIC:  Alert and oriented x 3 PSYCHIATRIC:  Normal affect   ASSESSMENT:    1. Left bundle branch block   2. Essential hypertension   3. Mixed hyperlipidemia   4. Sinus bradycardia    PLAN:    In order of problems listed above:  1. Left bundle  branch block interestingly her EKG today did not show bundle it showed sinus bradycardia low voltage QRS nonspecific ST segment changes.  Concern is bradycardia.  She never had it before that to this degree.  She is not on any sinus node suppressing agent.  I will ask her to wear Zio patch for a week to make sure there is no critical bradycardia. 2. Essential hypertension blood pressure 130/68 controlled continue present management. 3. Dyslipidemia: I did review her K PN which  showed LDL of 59 and a HDL of 57 this is from November 28, 2019 she is on Crestor 10 which I will continue. 4. Sinus bradycardia 50 concerning new finding.  Monitor will be done.  I will also ask her to have an echocardiogram to recheck left ventricle ejection fraction.  She also have mild degree of aortic stenosis which need to be rechecked   Medication Adjustments/Labs and Tests Ordered: Current medicines are reviewed at length with the patient today.  Concerns regarding medicines are outlined above.  No orders of the defined types were placed in this encounter.  Medication changes: No orders of the defined types were placed in this encounter.   Signed, Park Liter, MD, [facility] 03/06/2020 1:34 PM    [facility]

## 2020-03-06 NOTE — Patient Instructions (Signed)
Medication Instructions:  Your physician recommends that you continue on your current medications as directed. Please refer to the Current Medication list given to you today.  *If you need a refill on your cardiac medications before your next appointment, please call your pharmacy*   Lab Work: None.  If you have labs (blood work) drawn today and your tests are completely normal, you will receive your results only by: . MyChart Message (if you have MyChart) OR . A paper copy in the mail If you have any lab test that is abnormal or we need to change your treatment, we will call you to review the results.   Testing/Procedures: Your physician has requested that you have an echocardiogram. Echocardiography is a painless test that uses sound waves to create images of your heart. It provides your doctor with information about the size and shape of your heart and how well your heart's chambers and valves are working. This procedure takes approximately one hour. There are no restrictions for this procedure.  A zio monitor was ordered today. It will remain on for 7 days. You will then return monitor and event diary in provided box. It takes 1-2 weeks for report to be downloaded and returned to us. We will call you with the results. If monitor falls off or has orange flashing light, please call Zio for further instructions.      Follow-Up: At [facility], you and your health needs are our priority.  As part of our continuing mission to provide you with exceptional heart care, we have created designated Provider Care Teams.  These Care Teams include your primary Cardiologist (physician) and Advanced Practice Providers (APPs -  Physician Assistants and Nurse Practitioners) who all work together to provide you with the care you need, when you need it.  We recommend signing up for the patient portal called "MyChart".  Sign up information is provided on this After Visit Summary.  MyChart is used to  connect with patients for Virtual Visits (Telemedicine).  Patients are able to view lab/test results, encounter notes, upcoming appointments, etc.  Non-urgent messages can be sent to your provider as well.   To learn more about what you can do with MyChart, go to [email or web address].    Your next appointment:   6 month(s)  The format for your next appointment:   In Person  Provider:   Robert Krasowski, MD   Other Instructions   Echocardiogram An echocardiogram is a procedure that uses painless sound waves (ultrasound) to produce an image of the heart. Images from an echocardiogram can provide important information about:  Signs of coronary artery disease (CAD).  Aneurysm detection. An aneurysm is a weak or damaged part of an artery wall that bulges out from the normal force of blood pumping through the body.  Heart size and shape. Changes in the size or shape of the heart can be associated with certain conditions, including heart failure, aneurysm, and CAD.  Heart muscle function.  Heart valve function.  Signs of a past heart attack.  Fluid buildup around the heart.  Thickening of the heart muscle.  A tumor or infectious growth around the heart valves. Tell a health care provider about:  Any allergies you have.  All medicines you are taking, including vitamins, herbs, eye drops, creams, and over-the-counter medicines.  Any blood disorders you have.  Any surgeries you have had.  Any medical conditions you have.  Whether you are pregnant or may be pregnant. What are the risks?   Generally, this is a safe procedure. However, problems may occur, including:  Allergic reaction to dye (contrast) that may be used during the procedure. What happens before the procedure? No specific preparation is needed. You may eat and drink normally. What happens during the procedure?   An IV tube may be inserted into one of your veins.  You may receive contrast through this  tube. A contrast is an injection that improves the quality of the pictures from your heart.  A gel will be applied to your chest.  A wand-like tool (transducer) will be moved over your chest. The gel will help to transmit the sound waves from the transducer.  The sound waves will harmlessly bounce off of your heart to allow the heart images to be captured in real-time motion. The images will be recorded on a computer. The procedure may vary among health care providers and hospitals. What happens after the procedure?  You may return to your normal, everyday life, including diet, activities, and medicines, unless your health care provider tells you not to do that. Summary  An echocardiogram is a procedure that uses painless sound waves (ultrasound) to produce an image of the heart.  Images from an echocardiogram can provide important information about the size and shape of your heart, heart muscle function, heart valve function, and fluid buildup around your heart.  You do not need to do anything to prepare before this procedure. You may eat and drink normally.  After the echocardiogram is completed, you may return to your normal, everyday life, unless your health care provider tells you not to do that. This information is not intended to replace advice given to you by your health care provider. Make sure you discuss any questions you have with your health care provider. Document Revised: 12/02/2018 Document Reviewed: 09/13/2016 Elsevier Patient Education  [place].

## 2020-03-09 ENCOUNTER — Ambulatory Visit (INDEPENDENT_AMBULATORY_CARE_PROVIDER_SITE_OTHER): Payer: Medicare Other

## 2020-03-09 DIAGNOSIS — R001 Bradycardia, unspecified: Secondary | ICD-10-CM

## 2020-03-16 ENCOUNTER — Encounter: Payer: Self-pay | Admitting: Cardiology

## 2020-03-23 ENCOUNTER — Other Ambulatory Visit: Payer: 59

## 2020-03-26 ENCOUNTER — Telehealth: Payer: Self-pay | Admitting: Cardiology

## 2020-03-26 DIAGNOSIS — R001 Bradycardia, unspecified: Secondary | ICD-10-CM | POA: Diagnosis not present

## 2020-03-26 NOTE — Telephone Encounter (Signed)
i[employer] called to report a abnormal monitor report. There were 5 episodes of 2 degree av block one causing a 4.9 sec pause. Report is being faxed.

## 2020-03-26 NOTE — Telephone Encounter (Signed)
It looks like she may require pacemaker.  Please schedule her to see Dr. Curt Bears, if she wants to talk to me first I be more than glad to do that

## 2020-03-26 NOTE — Telephone Encounter (Signed)
Called and spoke to patient daughter per dpr. Patient is living with a covid positive brother right now so patient was scheduled for 2 weeks out to see Dr. Agustin Cree as the daughter prefers her see Dr. Agustin Cree first.

## 2020-03-27 DIAGNOSIS — Z20822 Contact with and (suspected) exposure to covid-19: Secondary | ICD-10-CM | POA: Diagnosis not present

## 2020-03-27 DIAGNOSIS — R6889 Other general symptoms and signs: Secondary | ICD-10-CM | POA: Diagnosis not present

## 2020-03-28 ENCOUNTER — Telehealth: Payer: Self-pay | Admitting: Cardiology

## 2020-03-28 NOTE — Telephone Encounter (Signed)
Patient's daughter Vaughan Basta called to saying her mother was just DX with COVID today. She wants to know if she should reschedule her mother's office appt and her Echo appt she has scheduled in two weeks on 8/18.

## 2020-03-29 NOTE — Telephone Encounter (Signed)
Yes, I would postpone echocardiogram for another 2 weeks

## 2020-03-29 NOTE — Telephone Encounter (Signed)
Patient has been rescheduled daughter aware.

## 2020-04-11 ENCOUNTER — Ambulatory Visit: Payer: 59 | Admitting: Cardiology

## 2020-04-11 ENCOUNTER — Other Ambulatory Visit: Payer: 59

## 2020-04-23 ENCOUNTER — Other Ambulatory Visit: Payer: Self-pay

## 2020-04-23 ENCOUNTER — Ambulatory Visit (INDEPENDENT_AMBULATORY_CARE_PROVIDER_SITE_OTHER): Payer: Medicare Other

## 2020-04-23 DIAGNOSIS — I10 Essential (primary) hypertension: Secondary | ICD-10-CM

## 2020-04-23 DIAGNOSIS — R001 Bradycardia, unspecified: Secondary | ICD-10-CM | POA: Diagnosis not present

## 2020-04-23 DIAGNOSIS — I447 Left bundle-branch block, unspecified: Secondary | ICD-10-CM | POA: Diagnosis not present

## 2020-04-23 LAB — ECHOCARDIOGRAM COMPLETE
AR max vel: 0.86 cm2
AV Area VTI: 1 cm2
AV Area mean vel: 0.88 cm2
AV Mean grad: 28.4 mmHg
AV Peak grad: 49.1 mmHg
Ao pk vel: 3.5 m/s
Area-P 1/2: 3.2 cm2
S' Lateral: 2 cm

## 2020-04-23 NOTE — Progress Notes (Signed)
Complete echocardiogram performed.  Jimmy Daveah Varone RDCS, RVT  

## 2020-04-26 ENCOUNTER — Encounter: Payer: Self-pay | Admitting: Cardiology

## 2020-04-26 ENCOUNTER — Ambulatory Visit (INDEPENDENT_AMBULATORY_CARE_PROVIDER_SITE_OTHER): Payer: Medicare Other | Admitting: Cardiology

## 2020-04-26 ENCOUNTER — Other Ambulatory Visit: Payer: Self-pay

## 2020-04-26 VITALS — BP 142/70 | HR 58 | Ht 61.0 in | Wt 156.0 lb

## 2020-04-26 DIAGNOSIS — E782 Mixed hyperlipidemia: Secondary | ICD-10-CM | POA: Diagnosis not present

## 2020-04-26 DIAGNOSIS — I10 Essential (primary) hypertension: Secondary | ICD-10-CM | POA: Diagnosis not present

## 2020-04-26 DIAGNOSIS — R001 Bradycardia, unspecified: Secondary | ICD-10-CM | POA: Diagnosis not present

## 2020-04-26 NOTE — Patient Instructions (Signed)

## 2020-04-26 NOTE — Progress Notes (Signed)
Cardiology Office Note:    Date:  04/26/2020   ID:  YENIFER SACCENTE, DOB 08-03-1931, MRN [id number]  PCP:  Renaldo Reel, PA  Cardiologist:  Jenne Campus, MD    Referring MD: Renaldo Reel, [place]   Chief Complaint  Patient presents with  . Follow-up  I am doing fine  History of Present Illness:    Molly Ortega is a 86 y.o. female  With past medical history significant for left bundle branch block, essential hypertension, dyslipidemia.  Recently she wore monitor which show significant pauses especially during the night.  She comes today to my office to discuss that.  She comes with her daughter.  She denies having any chest pain, tightness, pressure, burning in the chest.  There is no dizziness there is no passing out no palpitations.  Past Medical History:  Diagnosis Date  . Arthritis   . Chronic cough    cronic , dry ; patient states " ive had this cough for almost all my life, my old doctor, Dr Marcello Moores thought it might be from some asthma or allergies but never said for sure"  . Chronic kidney disease (CKD), stage III (moderate)   . Chronic pain syndrome 01/06/2018  . Glaucoma   . Hepatic steatosis   . Hyperlipidemia 05/19/2018  . Hypertension 05/19/2018  . Left bundle branch block (LBBB) on electrocardiogram   . Mild aortic valve stenosis   . Paresthesias 09/25/2017  . Peripheral neuropathy   . Pre-diabetes   . Primary open angle glaucoma (POAG) of both eyes, moderate stage 11/30/2017  . Pseudophakia of both eyes 11/30/2017  . Rosacea     Past Surgical History:  Procedure Laterality Date  . CARPAL TUNNEL RELEASE Right   . CHOLECYSTECTOMY  2015  . DILATION AND CURETTAGE, DIAGNOSTIC / THERAPEUTIC  1960s  . EYE SURGERY Bilateral 08/24/2004   cataract Basic no lensx  . REVERSE SHOULDER ARTHROPLASTY Right 02/02/2019   Procedure: REVERSE SHOULDER ARTHROPLASTY;  Surgeon: Hiram Gash, MD;  Location: [facility];  Service: Orthopedics;  Laterality: Right;    Current  Medications: Current Meds  Medication Sig  . allopurinol (ZYLOPRIM) 100 MG tablet Take 100 mg by mouth daily.   . Brinzolamide-Brimonidine (SIMBRINZA) 1-0.2 % SUSP INSTILL 1 DROP INTO EACH EYE TWICE DAILY  . denosumab (PROLIA) 60 MG/ML SOSY injection Inject 60 mg into the skin every 6 (six) months.  . dorzolamide (TRUSOPT) 2 % ophthalmic solution Place 1 drop into both eyes 2 (two) times daily.   . hydrochlorothiazide (HYDRODIURIL) 25 MG tablet Take 25 mg by mouth daily.  Marland Kitchen icosapent Ethyl (VASCEPA) 1 g capsule Take 2 g by mouth 2 (two) times daily.  Marland Kitchen latanoprost (XALATAN) 0.005 % ophthalmic solution Apply 1 drop to eye at bedtime.   Marland Kitchen losartan (COZAAR) 100 MG tablet Take 100 mg by mouth daily.   . magnesium oxide (MAG-OX) 400 MG tablet Take 400 mg by mouth daily.   . Multiple Vitamin (MULTI-VITAMINS) TABS Take 1 tablet by mouth daily.   . niacin 100 MG tablet Take 100 mg by mouth daily.   . Omega-3 1000 MG CAPS Take 1,000 mg by mouth daily.   . Potassium 99 MG TABS Take 99 mg by mouth daily.   . rosuvastatin (CRESTOR) 20 MG tablet Take 20 mg by mouth daily.     Allergies:   Patient has no known allergies.   Social History   Socioeconomic History  . Marital status: Widowed  Spouse name: Not on file  . Number of children: Not on file  . Years of education: Not on file  . Highest education level: Not on file  Occupational History  . Not on file  Tobacco Use  . Smoking status: Former Research scientist (life sciences)  . Smokeless tobacco: Never Used  Vaping Use  . Vaping Use: Never used  Substance and Sexual Activity  . Alcohol use: Not Currently  . Drug use: Never  . Sexual activity: Not on file  Other Topics Concern  . Not on file  Social History Narrative  . Not on file   Social Determinants of Health   Financial Resource Strain:   . Difficulty of Paying Living Expenses: Not on file  Food Insecurity:   . Worried About Charity fundraiser in the Last Year: Not on file  . Ran Out of Food in  the Last Year: Not on file  Transportation Needs:   . Lack of Transportation (Medical): Not on file  . Lack of Transportation (Non-Medical): Not on file  Physical Activity:   . Days of Exercise per Week: Not on file  . Minutes of Exercise per Session: Not on file  Stress:   . Feeling of Stress : Not on file  Social Connections:   . Frequency of Communication with Friends and Family: Not on file  . Frequency of Social Gatherings with Friends and Family: Not on file  . Attends Religious Services: Not on file  . Active Member of Clubs or Organizations: Not on file  . Attends Archivist Meetings: Not on file  . Marital Status: Not on file     Family History: The patient's family history includes Diabetes in her father; Heart attack in her father and mother; Lung cancer in her mother. ROS:   Please see the history of present illness.    All 14 point review of systems negative except as described per history of present illness  EKGs/Labs/Other Studies Reviewed:      Recent Labs: No results found for requested labs within last 8760 hours.  Recent Lipid Panel No results found for: CHOL, TRIG, HDL, CHOLHDL, VLDL, LDLCALC, LDLDIRECT  Physical Exam:    VS:  BP (!) 142/70 (BP Location: Left Arm, Patient Position: Sitting, Cuff Size: Normal)   Pulse (!) 58   Ht 5\' 1"  (1.549 m)   Wt 156 lb (70.8 kg)   SpO2 97%   BMI 29.48 kg/m     Wt Readings from Last 3 Encounters:  04/26/20 156 lb (70.8 kg)  03/06/20 157 lb 12.8 oz (71.6 kg)  07/15/19 164 lb (74.4 kg)     GEN:  Well nourished, well developed in no acute distress HEENT: Normal NECK: No JVD; No carotid bruits LYMPHATICS: No lymphadenopathy CARDIAC: RRR, systolic ejection murmur grade 1/6 best heard right upper portion of the sternum, no rubs, no gallops RESPIRATORY:  Clear to auscultation without rales, wheezing or rhonchi  ABDOMEN: Soft, non-tender, non-distended MUSCULOSKELETAL:  No edema; No deformity  SKIN:  Warm and dry LOWER EXTREMITIES: no swelling NEUROLOGIC:  Alert and oriented x 3 PSYCHIATRIC:  Normal affect   ASSESSMENT:    1. Sinus bradycardia   2. Mixed hyperlipidemia   3. Essential hypertension    PLAN:    In order of problems listed above:  1. Sinus bradycardia with pauses during the night and I am afraid the mechanism of pauses is high degree AV block.  I cannot distinguish specifically since there is a poor  baseline and P waves are difficult to see but I am worried about high degree AV block.  Those are happening only during the night and she is also asymptomatic, therefore, I probability that this is related to sleep apnea is still there.  We did talk in length with her as well as with daughter about potentially pacemaker.  Initial impression was that they simply do not want to do that.  However I told him that should reconsider it.  This is the issue of comfort measures as well as overall life.  They are at least willing to explore it.  I explained to them what pacemaker is what the procedure is like.  Also I pressed towards checking her for obstructive sleep apnea.  Her family tells me that she does snore at night.  However patient told me she does not want to do any test for that.  She does not want to have home sleep study, she does not want to have study done in the laboratory she said that she cares a lot about her hair and she does not want to have it done.  I will investigate if I can do at least overnight pulse oximetry. 2. Mixed dyslipidemia: She is on statin which I will continue. 3. Left bundle branch block, today EKG did not show that showed only sinus bradycardia rate 58 normal QS complex duration morphology. 4. Essential hypertension blood pressure slightly elevated today but we will continue monitoring it.   Medication Adjustments/Labs and Tests Ordered: Current medicines are reviewed at length with the patient today.  Concerns regarding medicines are outlined above.   Orders Placed This Encounter  Procedures  . Ambulatory referral to Cardiac Electrophysiology  . EKG 12-Lead   Medication changes: No orders of the defined types were placed in this encounter.   Signed, Park Liter, MD, [facility] 04/26/2020 10:20 AM    [place]

## 2020-05-10 DIAGNOSIS — M48062 Spinal stenosis, lumbar region with neurogenic claudication: Secondary | ICD-10-CM | POA: Diagnosis not present

## 2020-05-10 DIAGNOSIS — M6281 Muscle weakness (generalized): Secondary | ICD-10-CM | POA: Diagnosis not present

## 2020-05-10 DIAGNOSIS — R262 Difficulty in walking, not elsewhere classified: Secondary | ICD-10-CM | POA: Diagnosis not present

## 2020-05-10 DIAGNOSIS — M545 Low back pain: Secondary | ICD-10-CM | POA: Diagnosis not present

## 2020-05-17 DIAGNOSIS — M545 Low back pain: Secondary | ICD-10-CM | POA: Diagnosis not present

## 2020-05-17 DIAGNOSIS — M6281 Muscle weakness (generalized): Secondary | ICD-10-CM | POA: Diagnosis not present

## 2020-05-17 DIAGNOSIS — R262 Difficulty in walking, not elsewhere classified: Secondary | ICD-10-CM | POA: Diagnosis not present

## 2020-05-17 DIAGNOSIS — M48062 Spinal stenosis, lumbar region with neurogenic claudication: Secondary | ICD-10-CM | POA: Diagnosis not present

## 2020-05-24 DIAGNOSIS — M545 Low back pain: Secondary | ICD-10-CM | POA: Diagnosis not present

## 2020-05-24 DIAGNOSIS — R262 Difficulty in walking, not elsewhere classified: Secondary | ICD-10-CM | POA: Diagnosis not present

## 2020-05-24 DIAGNOSIS — M6281 Muscle weakness (generalized): Secondary | ICD-10-CM | POA: Diagnosis not present

## 2020-05-24 DIAGNOSIS — M48062 Spinal stenosis, lumbar region with neurogenic claudication: Secondary | ICD-10-CM | POA: Diagnosis not present

## 2020-05-28 DIAGNOSIS — Z23 Encounter for immunization: Secondary | ICD-10-CM | POA: Diagnosis not present

## 2020-05-28 DIAGNOSIS — R7303 Prediabetes: Secondary | ICD-10-CM | POA: Diagnosis not present

## 2020-05-28 DIAGNOSIS — M109 Gout, unspecified: Secondary | ICD-10-CM | POA: Diagnosis not present

## 2020-05-28 DIAGNOSIS — Z1331 Encounter for screening for depression: Secondary | ICD-10-CM | POA: Diagnosis not present

## 2020-05-28 DIAGNOSIS — I129 Hypertensive chronic kidney disease with stage 1 through stage 4 chronic kidney disease, or unspecified chronic kidney disease: Secondary | ICD-10-CM | POA: Diagnosis not present

## 2020-05-28 DIAGNOSIS — Z6829 Body mass index (BMI) 29.0-29.9, adult: Secondary | ICD-10-CM | POA: Diagnosis not present

## 2020-05-28 DIAGNOSIS — N814 Uterovaginal prolapse, unspecified: Secondary | ICD-10-CM | POA: Diagnosis not present

## 2020-05-28 DIAGNOSIS — N183 Chronic kidney disease, stage 3 unspecified: Secondary | ICD-10-CM | POA: Diagnosis not present

## 2020-05-28 DIAGNOSIS — L84 Corns and callosities: Secondary | ICD-10-CM | POA: Diagnosis not present

## 2020-05-28 DIAGNOSIS — Z9181 History of falling: Secondary | ICD-10-CM | POA: Diagnosis not present

## 2020-05-28 DIAGNOSIS — M810 Age-related osteoporosis without current pathological fracture: Secondary | ICD-10-CM | POA: Diagnosis not present

## 2020-05-28 DIAGNOSIS — E785 Hyperlipidemia, unspecified: Secondary | ICD-10-CM | POA: Diagnosis not present

## 2020-05-29 ENCOUNTER — Encounter: Payer: Self-pay | Admitting: Cardiology

## 2020-05-29 ENCOUNTER — Other Ambulatory Visit: Payer: Self-pay

## 2020-05-29 ENCOUNTER — Ambulatory Visit (INDEPENDENT_AMBULATORY_CARE_PROVIDER_SITE_OTHER): Payer: Medicare Other | Admitting: Cardiology

## 2020-05-29 VITALS — BP 128/64 | HR 57 | Ht 61.0 in | Wt 153.2 lb

## 2020-05-29 DIAGNOSIS — I495 Sick sinus syndrome: Secondary | ICD-10-CM | POA: Diagnosis not present

## 2020-05-29 NOTE — Progress Notes (Signed)
Electrophysiology Office Note   Date:  05/29/2020   ID:  Molly Ortega, DOB Jan 02, 1931, MRN [id number]  PCP:  Molly Reel, PA  Cardiologist:  Molly Ortega Primary Electrophysiologist:  Molly Ortega Molly Leeds, MD    Chief Complaint: bradycardia   History of Present Illness: Molly Ortega is a 86 y.o. female who is being seen today for the evaluation of bradycardia at the request of Park Liter, MD. Presenting today for electrophysiology evaluation.  She has a history significant for left bundle branch block, hypertension, hyperlipidemia.  She wore a cardiac monitor which showed significant pauses which appeared to be nocturnal.  Today, she denies symptoms of palpitations, chest pain, shortness of breath, orthopnea, PND, lower extremity edema, claudication, dizziness, presyncope, syncope, bleeding, or neurologic sequela. The patient is tolerating medications without difficulties.  She currently feels well.  She has no chest pain or shortness of breath.  She is able to do all of her daily activities.  Her daughter, she is not very active throughout the day.  She sleeps quite a bit on the couch while watching TV.  Neither her or her daughter feel that she is having symptoms from bradycardia.   Past Medical History:  Diagnosis Date  . Arthritis   . Chronic cough    cronic , dry ; patient states " ive had this cough for almost all my life, my old doctor, Dr Marcello Moores thought it might be from some asthma or allergies but never said for sure"  . Chronic kidney disease (CKD), stage III (moderate) (HCC)   . Chronic pain syndrome 01/06/2018  . Glaucoma   . Hepatic steatosis   . Hyperlipidemia 05/19/2018  . Hypertension 05/19/2018  . Left bundle branch block (LBBB) on electrocardiogram   . Mild aortic valve stenosis   . Paresthesias 09/25/2017  . Peripheral neuropathy   . Pre-diabetes   . Primary open angle glaucoma (POAG) of both eyes, moderate stage 11/30/2017  . Pseudophakia of both  eyes 11/30/2017  . Rosacea    Past Surgical History:  Procedure Laterality Date  . CARPAL TUNNEL RELEASE Right   . CHOLECYSTECTOMY  2015  . DILATION AND CURETTAGE, DIAGNOSTIC / THERAPEUTIC  1960s  . EYE SURGERY Bilateral 08/24/2004   cataract Basic no lensx  . REVERSE SHOULDER ARTHROPLASTY Right 02/02/2019   Procedure: REVERSE SHOULDER ARTHROPLASTY;  Surgeon: Hiram Gash, MD;  Location: [facility];  Service: Orthopedics;  Laterality: Right;     Current Outpatient Medications  Medication Sig Dispense Refill  . allopurinol (ZYLOPRIM) 100 MG tablet Take 100 mg by mouth daily.     . Brinzolamide-Brimonidine (SIMBRINZA) 1-0.2 % SUSP INSTILL 1 DROP INTO EACH EYE TWICE DAILY    . denosumab (PROLIA) 60 MG/ML SOSY injection Inject 60 mg into the skin every 6 (six) months.    . hydrochlorothiazide (HYDRODIURIL) 25 MG tablet Take 25 mg by mouth daily.    Marland Kitchen icosapent Ethyl (VASCEPA) 1 g capsule Take 2 g by mouth 2 (two) times daily.    Marland Kitchen latanoprost (XALATAN) 0.005 % ophthalmic solution Apply 1 drop to eye at bedtime.     Marland Kitchen losartan (COZAAR) 100 MG tablet Take 100 mg by mouth daily.     . magnesium oxide (MAG-OX) 400 MG tablet Take 400 mg by mouth daily.     . Multiple Vitamin (MULTI-VITAMINS) TABS Take 1 tablet by mouth daily.     . niacin 100 MG tablet Take 100 mg by mouth daily.     Marland Kitchen  Omega-3 1000 MG CAPS Take 1,000 mg by mouth daily.     . Potassium 99 MG TABS Take 99 mg by mouth daily.     . rosuvastatin (CRESTOR) 20 MG tablet Take 20 mg by mouth daily.     No current facility-administered medications for this visit.    Allergies:   Patient has no known allergies.   Social History:  The patient  reports that she has quit smoking. She has never used smokeless tobacco. She reports previous alcohol use. She reports that she does not use drugs.   Family History:  The patient's family history includes Diabetes in her father; Heart attack in her father and mother; Lung cancer in her mother.     ROS:  Please see the history of present illness.   Otherwise, review of systems is positive for none.   All other systems are reviewed and negative.    PHYSICAL EXAM: VS:  BP 128/64   Pulse (!) 57   Ht 5\' 1"  (1.549 m)   Wt 153 lb 3.2 oz (69.5 kg)   SpO2 96%   BMI 28.95 kg/m  , BMI Body mass index is 28.95 kg/m. GEN: Well nourished, well developed, in no acute distress  HEENT: normal  Neck: no JVD, carotid bruits, or masses Cardiac: RRR; no murmurs, rubs, or gallops,no edema  Respiratory:  clear to auscultation bilaterally, normal work of breathing GI: soft, nontender, nondistended, + BS MS: no deformity or atrophy  Skin: warm and dry Neuro:  Strength and sensation are intact Psych: euthymic mood, full affect  EKG:  EKG is not ordered today. Personal review of the ekg ordered 04/26/2020 shows sinus rhythm, rate 58  Recent Labs: No results found for requested labs within last 8760 hours.    Lipid Panel  No results found for: CHOL, TRIG, HDL, CHOLHDL, VLDL, LDLCALC, LDLDIRECT   Wt Readings from Last 3 Encounters:  05/29/20 153 lb 3.2 oz (69.5 kg)  04/26/20 156 lb (70.8 kg)  03/06/20 157 lb 12.8 oz (71.6 kg)      Other studies Reviewed: Additional studies/ records that were reviewed today include: TTE 04/23/20  Review of the above records today demonstrates:  1. Left ventricular ejection fraction, by estimation, is 60 to 65%. The  left ventricle has normal function. The left ventricle has no regional  wall motion abnormalities. Left ventricular diastolic parameters are  consistent with Grade I diastolic  dysfunction (impaired relaxation). The average left ventricular global  longitudinal strain is -14.6 %.  2. Right ventricular systolic function is normal. The right ventricular  size is normal. There is normal pulmonary artery systolic pressure.  3. The moderate posterior mitral annular calcification.  4. Trivial mitral valve regurgitation. No evidence of  mitral stenosis.  5. The aortic valve is thickened and heavily calcified. Aortic valve  regurgitation is not visualized. Moderate aortic valve stenosis. Aortic  valve area, by VTI measures 1.00 cm. Aortic valve mean gradient measures  28.4 mmHg. Aortic valve Vmax measures  3.50 m/s.  6. The inferior vena cava is normal in size with greater than 50%  respiratory variability, suggesting right atrial pressure of 3 mmHg.   Cardiac monitor 04/06/2020 personally reviewed High degree AV block noted with pauses up to 4.9 seconds. Episode of supraventricular tachycardia noted  ASSESSMENT AND PLAN:  1.  Sinus bradycardia: Found on cardiac monitoring.  She also has nocturnal pauses of up to 5 seconds.  Per the patient and her daughter, she does not have symptoms  from her bradycardia.  She is not very active throughout the day, and falls asleep quite often on the couch.  Her daughter feels that this is due to watching quite a bit of TV.  Neither the patient nor her daughter feel that they want to pursue pacemaker implant unless she develops symptoms.  We Evie Crumpler continue to monitor.  2.  Hyperlipidemia: Continue Crestor per primary cardiology  3.  Hypertension: Currently well controlled  Case discussed with primary cardiology  Current medicines are reviewed at length with the patient today.   The patient does not have concerns regarding her medicines.  The following changes were made today:  none  Labs/ tests ordered today include:  No orders of the defined types were placed in this encounter.    Disposition:   FU with Emmamarie Kluender as needed months  Signed, Donesha Wallander Molly Leeds, MD  05/29/2020 11:11 AM     [facility] [address] [place] [address] [phone] (office) [phone] (fax)

## 2020-05-31 DIAGNOSIS — M545 Low back pain, unspecified: Secondary | ICD-10-CM | POA: Diagnosis not present

## 2020-05-31 DIAGNOSIS — R262 Difficulty in walking, not elsewhere classified: Secondary | ICD-10-CM | POA: Diagnosis not present

## 2020-05-31 DIAGNOSIS — M6281 Muscle weakness (generalized): Secondary | ICD-10-CM | POA: Diagnosis not present

## 2020-05-31 DIAGNOSIS — M48062 Spinal stenosis, lumbar region with neurogenic claudication: Secondary | ICD-10-CM | POA: Diagnosis not present

## 2020-06-07 DIAGNOSIS — R262 Difficulty in walking, not elsewhere classified: Secondary | ICD-10-CM | POA: Diagnosis not present

## 2020-06-07 DIAGNOSIS — M48062 Spinal stenosis, lumbar region with neurogenic claudication: Secondary | ICD-10-CM | POA: Diagnosis not present

## 2020-06-07 DIAGNOSIS — M6281 Muscle weakness (generalized): Secondary | ICD-10-CM | POA: Diagnosis not present

## 2020-06-07 DIAGNOSIS — M545 Low back pain, unspecified: Secondary | ICD-10-CM | POA: Diagnosis not present

## 2020-06-12 ENCOUNTER — Encounter: Payer: Self-pay | Admitting: Sports Medicine

## 2020-06-12 ENCOUNTER — Ambulatory Visit (INDEPENDENT_AMBULATORY_CARE_PROVIDER_SITE_OTHER): Payer: Medicare Other | Admitting: Sports Medicine

## 2020-06-12 ENCOUNTER — Other Ambulatory Visit: Payer: Self-pay

## 2020-06-12 DIAGNOSIS — L84 Corns and callosities: Secondary | ICD-10-CM

## 2020-06-12 DIAGNOSIS — M898X9 Other specified disorders of bone, unspecified site: Secondary | ICD-10-CM

## 2020-06-12 DIAGNOSIS — Z8739 Personal history of other diseases of the musculoskeletal system and connective tissue: Secondary | ICD-10-CM

## 2020-06-12 DIAGNOSIS — M79672 Pain in left foot: Secondary | ICD-10-CM

## 2020-06-12 MED ORDER — LIDOCAINE 5 % EX OINT: 1.0000 "application " | TOPICAL_OINTMENT | CUTANEOUS | 0 refills | Status: DC | PRN

## 2020-06-12 NOTE — Progress Notes (Signed)
Subjective: Molly Ortega is a 86 y.o. female patient who presents to office for evaluation of Left foot pain secondary to callus skin. Patient complains of pain at the lesion present Left foot at the at the side for years and sometimes it takes swells where it feels achy and admits that she used OTC corn pad and ped egg and is limited with the styles of shoes that she can wear and most of time at home walks barefoot with socks. Patient denies any other pedal complaints.   Review of Systems  All other systems reviewed and are negative.    Patient Active Problem List   Diagnosis Date Noted  . Sinus bradycardia 03/06/2020  . Neuropathy 02/17/2020  . Rotator cuff tear arthropathy, right 02/02/2019  . Spinal stenosis of lumbar region with neurogenic claudication 09/01/2018  . Left bundle branch block 07/08/2018  . Hyperlipidemia 05/19/2018  . Essential hypertension 05/19/2018  . Chronic pain syndrome 01/06/2018  . Primary open angle glaucoma (POAG) of both eyes, moderate stage 11/30/2017  . Pseudophakia of both eyes 11/30/2017  . Paresthesias 09/25/2017    Current Outpatient Medications on File Prior to Visit  Medication Sig Dispense Refill  . allopurinol (ZYLOPRIM) 100 MG tablet Take 100 mg by mouth daily.     . Brinzolamide-Brimonidine (SIMBRINZA) 1-0.2 % SUSP INSTILL 1 DROP INTO EACH EYE TWICE DAILY    . denosumab (PROLIA) 60 MG/ML SOSY injection Inject 60 mg into the skin every 6 (six) months.    . hydrochlorothiazide (HYDRODIURIL) 25 MG tablet Take 25 mg by mouth daily.    Marland Kitchen icosapent Ethyl (VASCEPA) 1 g capsule Take 2 g by mouth 2 (two) times daily.    Marland Kitchen latanoprost (XALATAN) 0.005 % ophthalmic solution Apply 1 drop to eye at bedtime.     Marland Kitchen losartan (COZAAR) 100 MG tablet Take 100 mg by mouth daily.     . magnesium oxide (MAG-OX) 400 MG tablet Take 400 mg by mouth daily.     . Multiple Vitamin (MULTI-VITAMINS) TABS Take 1 tablet by mouth daily.     . niacin 100 MG tablet Take  100 mg by mouth daily.     . Omega-3 1000 MG CAPS Take 1,000 mg by mouth daily.     . Potassium 99 MG TABS Take 99 mg by mouth daily.     . rosuvastatin (CRESTOR) 20 MG tablet Take 20 mg by mouth daily.     No current facility-administered medications on file prior to visit.    No Known Allergies  Objective:  General: Alert and oriented x3 in no acute distress  Dermatology: Keratotic lesion present 5th met base on left with skin lines transversing the lesion, pain is present with direct pressure to the lesion with a central nucleated core noted, no webspace macerations, no ecchymosis bilateral, all nails x 10 are well manicured.  Vascular: Dorsalis Pedis and Posterior Tibial pedal pulses 1/4, Capillary Fill Time 5 seconds, + scant pedal hair growth bilateral, no edema bilateral lower extremities, Temperature gradient within normal limits.  Neurology: Johney Maine sensation intact via light touch bilateral.  Musculoskeletal: Mild tenderness with palpation at the keratotic lesion site on left, severely limited first MPJ range of motion right greater than left with dorsal bony exostosis and mild digital contracture, there is limited midtarsal joint range of motion was not supportive of arthritis left and right foot, muscular strength 4/5 in all groups without major pain on range of motion however limited as above.  Pes planus  foot type.  Assessment and Plan: Problem List Items Addressed This Visit    None    Visit Diagnoses    Callus of foot    -  Primary   Left foot pain       Exostosis       History of gout          -Complete examination performed -Discussed treatment options for callus -Parred keratoic lesion using a chisel blade x1; treated the area withSalinocaine covered with moleskin -Encouraged daily skin emollients, gave sample of foot miracle cream -Encouraged use of topical lidocaine as needed for pain prescription sent to pharmacy -Advised good supportive shoes and  inserts -Patient to return to office in 10 to 12 weeks for callus follow-up or sooner if condition worsens.  Landis Martins, DPM

## 2020-06-14 DIAGNOSIS — M48062 Spinal stenosis, lumbar region with neurogenic claudication: Secondary | ICD-10-CM | POA: Diagnosis not present

## 2020-06-14 DIAGNOSIS — M545 Low back pain, unspecified: Secondary | ICD-10-CM | POA: Diagnosis not present

## 2020-06-14 DIAGNOSIS — R262 Difficulty in walking, not elsewhere classified: Secondary | ICD-10-CM | POA: Diagnosis not present

## 2020-06-14 DIAGNOSIS — M6281 Muscle weakness (generalized): Secondary | ICD-10-CM | POA: Diagnosis not present

## 2020-06-20 DIAGNOSIS — N3281 Overactive bladder: Secondary | ICD-10-CM | POA: Diagnosis not present

## 2020-06-20 DIAGNOSIS — M810 Age-related osteoporosis without current pathological fracture: Secondary | ICD-10-CM | POA: Diagnosis not present

## 2020-06-20 DIAGNOSIS — N765 Ulceration of vagina: Secondary | ICD-10-CM | POA: Diagnosis not present

## 2020-06-20 DIAGNOSIS — R197 Diarrhea, unspecified: Secondary | ICD-10-CM | POA: Diagnosis not present

## 2020-06-20 DIAGNOSIS — R351 Nocturia: Secondary | ICD-10-CM | POA: Diagnosis not present

## 2020-06-20 DIAGNOSIS — R54 Age-related physical debility: Secondary | ICD-10-CM | POA: Diagnosis not present

## 2020-06-20 DIAGNOSIS — L304 Erythema intertrigo: Secondary | ICD-10-CM | POA: Diagnosis not present

## 2020-06-20 DIAGNOSIS — H9190 Unspecified hearing loss, unspecified ear: Secondary | ICD-10-CM | POA: Diagnosis not present

## 2020-06-20 DIAGNOSIS — N39490 Overflow incontinence: Secondary | ICD-10-CM | POA: Diagnosis not present

## 2020-06-20 DIAGNOSIS — N8111 Cystocele, midline: Secondary | ICD-10-CM | POA: Diagnosis not present

## 2020-06-20 DIAGNOSIS — Z4689 Encounter for fitting and adjustment of other specified devices: Secondary | ICD-10-CM | POA: Diagnosis not present

## 2020-06-20 DIAGNOSIS — R159 Full incontinence of feces: Secondary | ICD-10-CM | POA: Diagnosis not present

## 2020-07-06 ENCOUNTER — Ambulatory Visit: Payer: Medicare Other | Admitting: Cardiology

## 2020-08-02 DIAGNOSIS — Z961 Presence of intraocular lens: Secondary | ICD-10-CM | POA: Diagnosis not present

## 2020-08-02 DIAGNOSIS — H6123 Impacted cerumen, bilateral: Secondary | ICD-10-CM | POA: Diagnosis not present

## 2020-08-02 DIAGNOSIS — H401132 Primary open-angle glaucoma, bilateral, moderate stage: Secondary | ICD-10-CM | POA: Diagnosis not present

## 2020-08-02 DIAGNOSIS — H26491 Other secondary cataract, right eye: Secondary | ICD-10-CM | POA: Diagnosis not present

## 2020-08-06 ENCOUNTER — Ambulatory Visit: Payer: Medicare Other | Admitting: Cardiology

## 2020-08-22 DIAGNOSIS — N765 Ulceration of vagina: Secondary | ICD-10-CM | POA: Diagnosis not present

## 2020-08-22 DIAGNOSIS — M48062 Spinal stenosis, lumbar region with neurogenic claudication: Secondary | ICD-10-CM | POA: Diagnosis not present

## 2020-08-22 DIAGNOSIS — G894 Chronic pain syndrome: Secondary | ICD-10-CM | POA: Diagnosis not present

## 2020-08-22 DIAGNOSIS — N8111 Cystocele, midline: Secondary | ICD-10-CM | POA: Diagnosis not present

## 2020-08-22 DIAGNOSIS — G629 Polyneuropathy, unspecified: Secondary | ICD-10-CM | POA: Diagnosis not present

## 2020-08-28 ENCOUNTER — Encounter: Payer: Self-pay | Admitting: Sports Medicine

## 2020-08-28 ENCOUNTER — Other Ambulatory Visit: Payer: Self-pay

## 2020-08-28 ENCOUNTER — Ambulatory Visit (INDEPENDENT_AMBULATORY_CARE_PROVIDER_SITE_OTHER): Payer: Medicare Other | Admitting: Sports Medicine

## 2020-08-28 DIAGNOSIS — M898X9 Other specified disorders of bone, unspecified site: Secondary | ICD-10-CM

## 2020-08-28 DIAGNOSIS — L84 Corns and callosities: Secondary | ICD-10-CM

## 2020-08-28 DIAGNOSIS — M79671 Pain in right foot: Secondary | ICD-10-CM

## 2020-08-28 DIAGNOSIS — Z8739 Personal history of other diseases of the musculoskeletal system and connective tissue: Secondary | ICD-10-CM

## 2020-08-28 DIAGNOSIS — M79672 Pain in left foot: Secondary | ICD-10-CM

## 2020-08-28 DIAGNOSIS — M2040 Other hammer toe(s) (acquired), unspecified foot: Secondary | ICD-10-CM

## 2020-08-28 NOTE — Progress Notes (Signed)
Subjective: Molly Ortega is a 87 y.o. female patient who presents to office for follow up callus care. Reports that  Callus on the left foot hurts a little. Admits some discoloration to nails. No other issues noted.   Patient is assisted by daughter this visit.     Patient Active Problem List   Diagnosis Date Noted  . Sinus bradycardia 03/06/2020  . Neuropathy 02/17/2020  . Rotator cuff tear arthropathy, right 02/02/2019  . Spinal stenosis of lumbar region with neurogenic claudication 09/01/2018  . Left bundle branch block 07/08/2018  . Hyperlipidemia 05/19/2018  . Essential hypertension 05/19/2018  . Chronic pain syndrome 01/06/2018  . Primary open angle glaucoma (POAG) of both eyes, moderate stage 11/30/2017  . Pseudophakia of both eyes 11/30/2017  . Paresthesias 09/25/2017    Current Outpatient Medications on File Prior to Visit  Medication Sig Dispense Refill  . allopurinol (ZYLOPRIM) 100 MG tablet Take 100 mg by mouth daily.     . Brinzolamide-Brimonidine (SIMBRINZA) 1-0.2 % SUSP INSTILL 1 DROP INTO EACH EYE TWICE DAILY    . denosumab (PROLIA) 60 MG/ML SOSY injection Inject 60 mg into the skin every 6 (six) months.    . hydrochlorothiazide (HYDRODIURIL) 25 MG tablet Take 25 mg by mouth daily.    Marland Kitchen icosapent Ethyl (VASCEPA) 1 g capsule Take 2 g by mouth 2 (two) times daily.    Marland Kitchen latanoprost (XALATAN) 0.005 % ophthalmic solution Apply 1 drop to eye at bedtime.     . lidocaine (XYLOCAINE) 5 % ointment Apply 1 application topically as needed. For foot pain 35.44 g 0  . losartan (COZAAR) 100 MG tablet Take 100 mg by mouth daily.     . magnesium oxide (MAG-OX) 400 MG tablet Take 400 mg by mouth daily.     . Multiple Vitamin (MULTI-VITAMINS) TABS Take 1 tablet by mouth daily.     . niacin 100 MG tablet Take 100 mg by mouth daily.     . Omega-3 1000 MG CAPS Take 1,000 mg by mouth daily.     . Potassium 99 MG TABS Take 99 mg by mouth daily.     . rosuvastatin (CRESTOR) 20 MG  tablet Take 20 mg by mouth daily.     No current facility-administered medications on file prior to visit.    No Known Allergies  Objective:  General: Alert and oriented x3 in no acute distress  Dermatology: Keratotic lesion present 5th met base on left>right and left 4th toe with skin lines transversing the lesion, pain is present with direct pressure to the lesion with a central nucleated core noted, no webspace macerations, no ecchymosis bilateral, all nails x 10 are thick and discolored but well manicured.  Vascular: Dorsalis Pedis and Posterior Tibial pedal pulses 1/4, Capillary Fill Time 5 seconds, + scant pedal hair growth bilateral, no edema bilateral lower extremities, Temperature gradient within normal limits.  Neurology: Johney Maine sensation intact via light touch bilateral.  Musculoskeletal: Mild tenderness with palpation at the keratotic lesion site on left>right>left 4th toe, severely limited first MPJ range of motion right greater than left with dorsal bony exostosis and mild digital contracture, there is limited midtarsal joint range of motion was not supportive of arthritis left and right foot, muscular strength 4/5 in all groups without major pain on range of motion however limited as above.  Pes planus foot type.  Assessment and Plan: Problem List Items Addressed This Visit   None   Visit Diagnoses    Callus of  foot    -  Primary   Foot pain, bilateral       Hammer toe, unspecified laterality       History of gout       Exostosis          -Complete examination performed -Discussed treatment options for callus -Parred keratoic lesion using a chisel blade x3 w/o incident  -Continue with foot miracle cream  -Dispensed left 4th toe cap  -Advised good supportive shoes and inserts -Recommend vicks for nails -Patient to return to office in 12-16 weeks for callus follow-up or sooner if condition worsens.  Landis Martins, DPM

## 2020-08-29 ENCOUNTER — Other Ambulatory Visit: Payer: Self-pay

## 2020-08-29 DIAGNOSIS — M1990 Unspecified osteoarthritis, unspecified site: Secondary | ICD-10-CM | POA: Insufficient documentation

## 2020-08-29 DIAGNOSIS — I447 Left bundle-branch block, unspecified: Secondary | ICD-10-CM | POA: Insufficient documentation

## 2020-08-29 DIAGNOSIS — R053 Chronic cough: Secondary | ICD-10-CM | POA: Insufficient documentation

## 2020-08-29 DIAGNOSIS — I350 Nonrheumatic aortic (valve) stenosis: Secondary | ICD-10-CM | POA: Insufficient documentation

## 2020-08-29 DIAGNOSIS — N183 Chronic kidney disease, stage 3 unspecified: Secondary | ICD-10-CM | POA: Insufficient documentation

## 2020-08-29 DIAGNOSIS — L719 Rosacea, unspecified: Secondary | ICD-10-CM | POA: Insufficient documentation

## 2020-08-29 DIAGNOSIS — R7303 Prediabetes: Secondary | ICD-10-CM | POA: Insufficient documentation

## 2020-08-29 DIAGNOSIS — H409 Unspecified glaucoma: Secondary | ICD-10-CM | POA: Insufficient documentation

## 2020-08-29 DIAGNOSIS — G629 Polyneuropathy, unspecified: Secondary | ICD-10-CM | POA: Insufficient documentation

## 2020-08-29 DIAGNOSIS — K760 Fatty (change of) liver, not elsewhere classified: Secondary | ICD-10-CM | POA: Insufficient documentation

## 2020-08-29 IMAGING — DX PORTABLE RIGHT SHOULDER
1 series · 1 of 1 positions shown · non-contrast
Comparison: CT right shoulder 06/07/2018.

CLINICAL DATA: Status post right shoulder replacement today.

EXAM:
PORTABLE RIGHT SHOULDER

[shoulder ap]
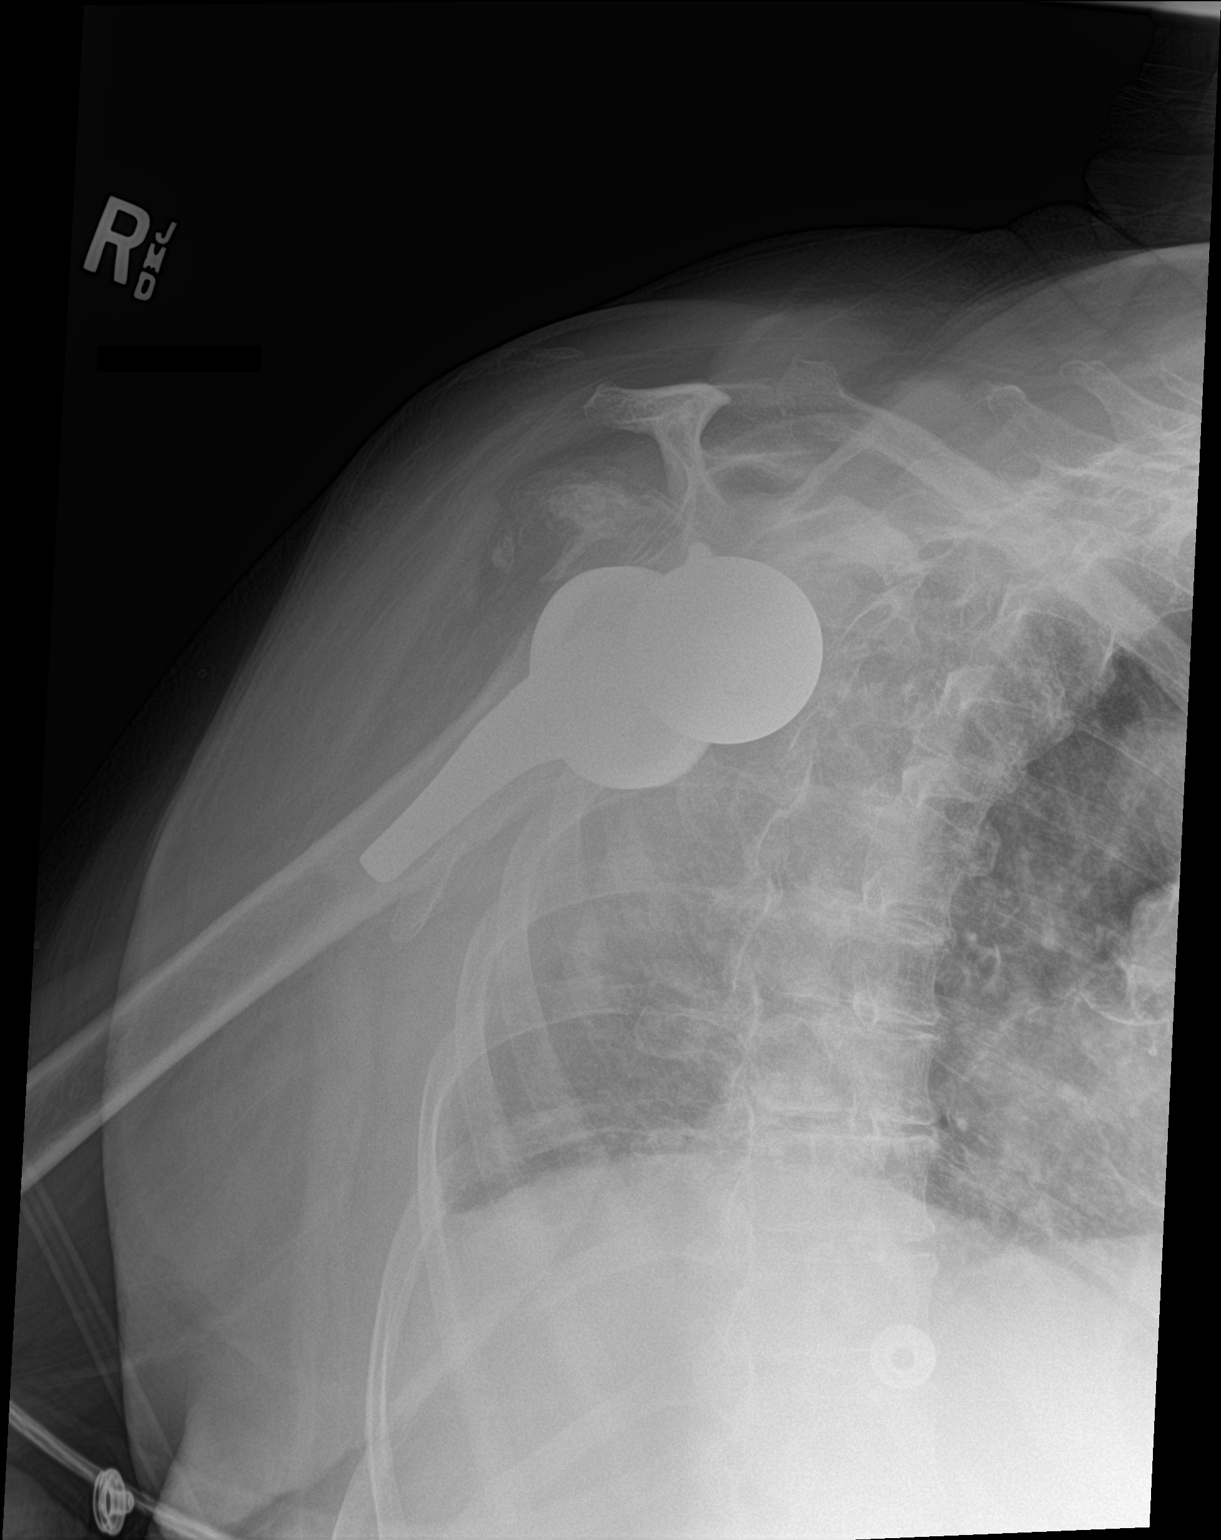

[1 of 1 positions shown; findings below may reference images not displayed]

FINDINGS: New reverse shoulder arthroplasty is in place. The device is
located. No fracture. Subacromial spur is noted. High-riding humeral
head seen on prior CT has been relieved. No acute abnormality.
IMPRESSION: Status post reverse right shoulder replacement.  No acute finding.

## 2020-08-29 IMAGING — DX PORTABLE RIGHT SHOULDER
1 series · 1 of 1 positions shown · non-contrast
Comparison: CT right shoulder 06/07/2018.

CLINICAL DATA: Status post right shoulder replacement today.

EXAM:
PORTABLE RIGHT SHOULDER

[shoulder ap]
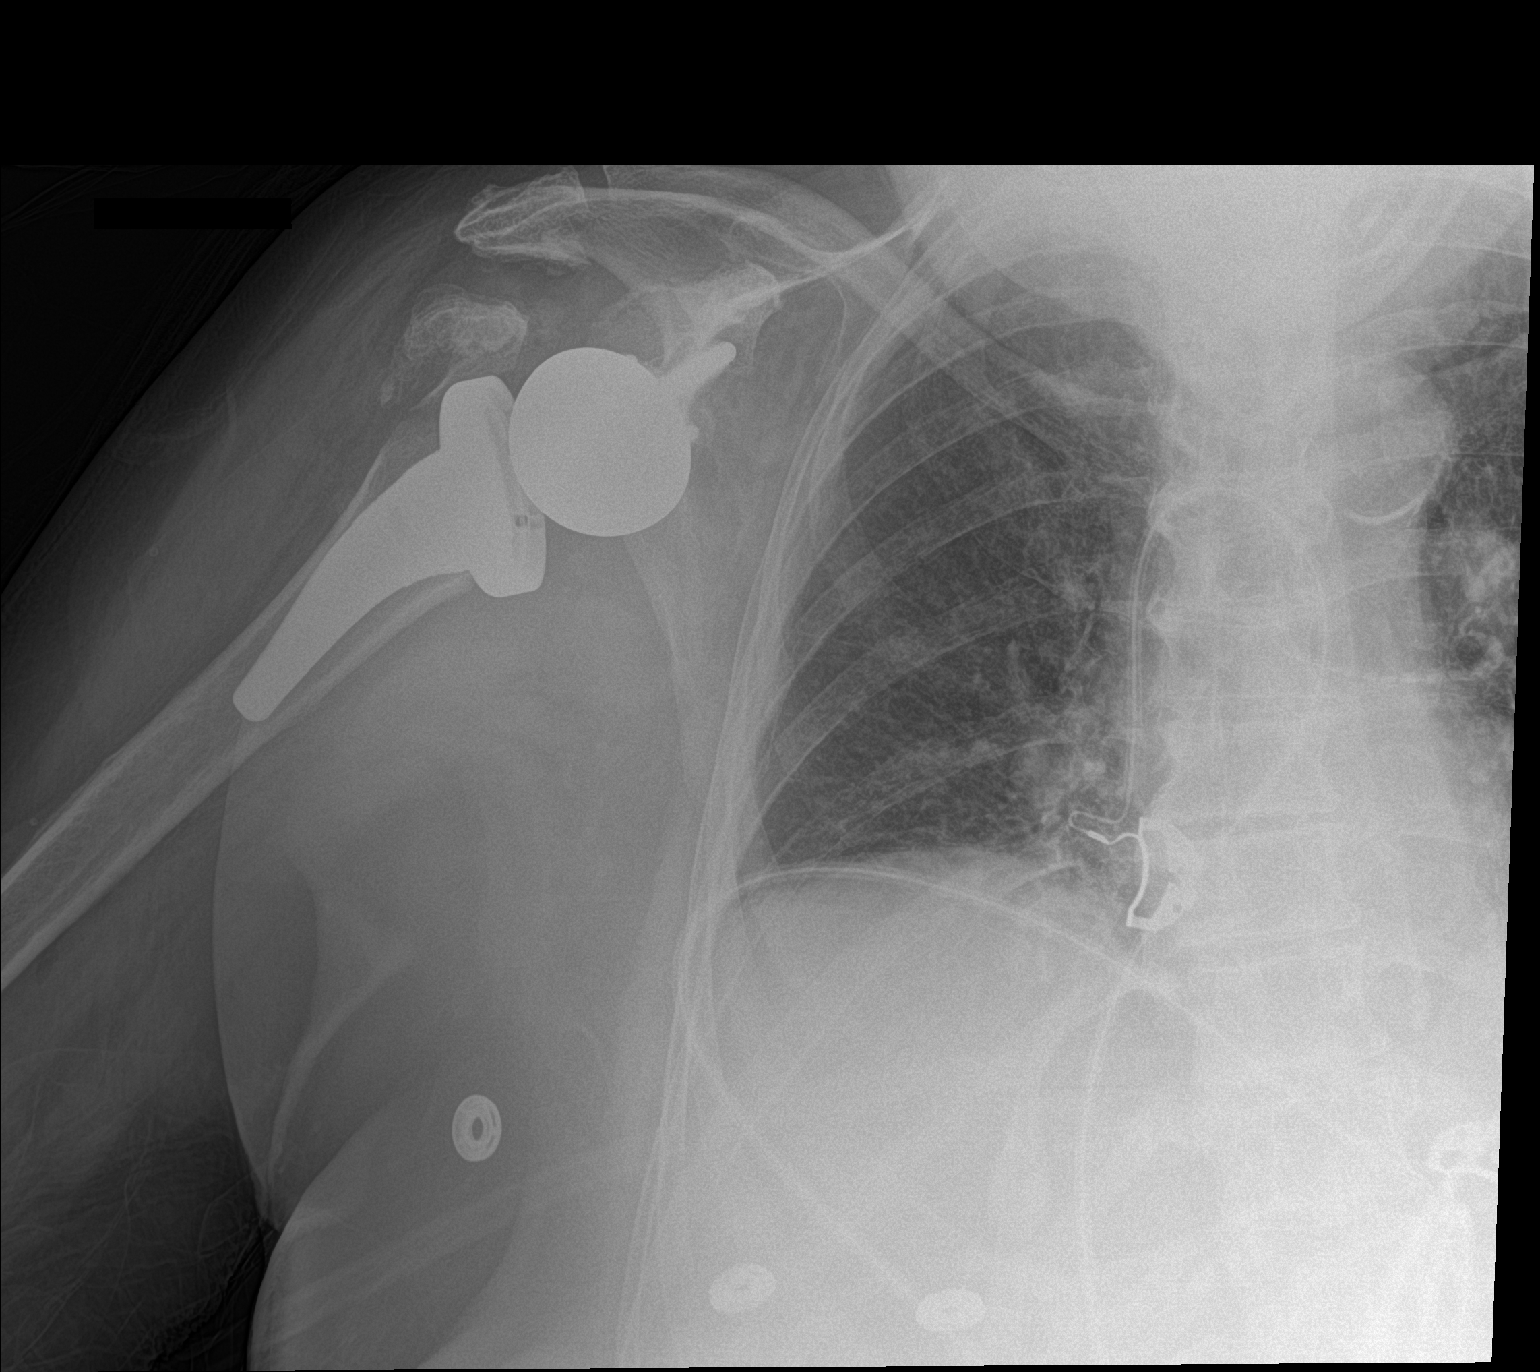

[1 of 1 positions shown; findings below may reference images not displayed]

FINDINGS: New reverse shoulder arthroplasty is in place. The device is
located. No fracture. Subacromial spur is noted. High-riding humeral
head seen on prior CT has been relieved. No acute abnormality.
IMPRESSION: Status post reverse right shoulder replacement.  No acute finding.

## 2020-08-31 ENCOUNTER — Ambulatory Visit (INDEPENDENT_AMBULATORY_CARE_PROVIDER_SITE_OTHER): Payer: Medicare Other | Admitting: Cardiology

## 2020-08-31 ENCOUNTER — Encounter: Payer: Self-pay | Admitting: Cardiology

## 2020-08-31 ENCOUNTER — Other Ambulatory Visit: Payer: Self-pay

## 2020-08-31 VITALS — BP 134/70 | HR 46 | Ht 60.0 in | Wt 152.0 lb

## 2020-08-31 DIAGNOSIS — I350 Nonrheumatic aortic (valve) stenosis: Secondary | ICD-10-CM | POA: Diagnosis not present

## 2020-08-31 DIAGNOSIS — R001 Bradycardia, unspecified: Secondary | ICD-10-CM

## 2020-08-31 DIAGNOSIS — I10 Essential (primary) hypertension: Secondary | ICD-10-CM | POA: Diagnosis not present

## 2020-08-31 DIAGNOSIS — I447 Left bundle-branch block, unspecified: Secondary | ICD-10-CM | POA: Diagnosis not present

## 2020-08-31 DIAGNOSIS — L570 Actinic keratosis: Secondary | ICD-10-CM | POA: Diagnosis not present

## 2020-08-31 NOTE — Progress Notes (Signed)
Cardiology Office Note:    Date:  08/31/2020   ID:  Molly Ortega, DOB 07-20-1931, MRN [id number]  PCP:  Renaldo Reel, PA  Cardiologist:  Jenne Campus, MD    Referring MD: Renaldo Reel, [place]   Chief Complaint  Patient presents with  . Results  I am doing fine  History of Present Illness:    Molly Ortega is a 87 y.o. female with past medical history significant for left bundle branch block, sinus bradycardia, dyslipidemia, moderate aortic stenosis.  Comes today to my office for follow-up.  Overall she is doing well.  She comes to me with her daughter her daughter tells me that she is living mostly sedentary lifestyle she watch TV sometimes she does some cooking and dishwashing but otherwise she sits and watch cooking channel.  Patient tells me she is doing fine she denies have any chest pain tightness squeezing pressure burning chest no dizziness no passing out.  Past Medical History:  Diagnosis Date  . Arthritis   . Chronic cough    cronic , dry ; patient states " ive had this cough for almost all my life, my old doctor, Dr Marcello Moores thought it might be from some asthma or allergies but never said for sure"  . Chronic kidney disease (CKD), stage III (moderate) (HCC)   . Chronic pain syndrome 01/06/2018  . Essential hypertension 05/19/2018  . Glaucoma   . Hepatic steatosis   . Hyperlipidemia 05/19/2018  . Hypertension 05/19/2018  . Left bundle branch block 07/08/2018  . Left bundle branch block (LBBB) on electrocardiogram   . Mild aortic valve stenosis   . Neuropathy 02/17/2020  . Paresthesias 09/25/2017  . Peripheral neuropathy   . Pre-diabetes   . Primary open angle glaucoma (POAG) of both eyes, moderate stage 11/30/2017  . Pseudophakia of both eyes 11/30/2017  . Rosacea   . Rotator cuff tear arthropathy, right 02/02/2019  . Sinus bradycardia 03/06/2020  . Spinal stenosis of lumbar region with neurogenic claudication 09/01/2018    Past Surgical History:  Procedure Laterality  Date  . CARPAL TUNNEL RELEASE Right   . CHOLECYSTECTOMY  2015  . DILATION AND CURETTAGE, DIAGNOSTIC / THERAPEUTIC  1960s  . EYE SURGERY Bilateral 08/24/2004   cataract Basic no lensx  . REVERSE SHOULDER ARTHROPLASTY Right 02/02/2019   Procedure: REVERSE SHOULDER ARTHROPLASTY;  Surgeon: Hiram Gash, MD;  Location: [facility];  Service: Orthopedics;  Laterality: Right;    Current Medications: Current Meds  Medication Sig  . allopurinol (ZYLOPRIM) 100 MG tablet Take 100 mg by mouth daily.   . Brinzolamide-Brimonidine (SIMBRINZA) 1-0.2 % SUSP INSTILL 1 DROP INTO EACH EYE TWICE DAILY  . denosumab (PROLIA) 60 MG/ML SOSY injection Inject 60 mg into the skin every 6 (six) months.  . hydrochlorothiazide (HYDRODIURIL) 25 MG tablet Take 25 mg by mouth daily.  Marland Kitchen icosapent Ethyl (VASCEPA) 1 g capsule Take 2 g by mouth 2 (two) times daily.  Marland Kitchen latanoprost (XALATAN) 0.005 % ophthalmic solution Apply 1 drop to eye at bedtime.   . lidocaine (XYLOCAINE) 5 % ointment Apply 1 application topically as needed. For foot pain  . losartan (COZAAR) 100 MG tablet Take 100 mg by mouth daily.   . magnesium oxide (MAG-OX) 400 MG tablet Take 400 mg by mouth daily.   . Multiple Vitamin (MULTI-VITAMINS) TABS Take 1 tablet by mouth daily.   . niacin 100 MG tablet Take 100 mg by mouth daily.   . Omega-3 1000 MG CAPS  Take 1,000 mg by mouth daily.   . Potassium 99 MG TABS Take 99 mg by mouth daily.   . rosuvastatin (CRESTOR) 20 MG tablet Take 20 mg by mouth daily.  . [DISCONTINUED] pregabalin (LYRICA) 75 MG capsule Take 1 capsule by mouth in the morning and at bedtime.     Allergies:   Lyrica [pregabalin]   Social History   Socioeconomic History  . Marital status: Widowed    Spouse name: Not on file  . Number of children: Not on file  . Years of education: Not on file  . Highest education level: Not on file  Occupational History  . Not on file  Tobacco Use  . Smoking status: Former Research scientist (life sciences)  . Smokeless tobacco:  Never Used  Vaping Use  . Vaping Use: Never used  Substance and Sexual Activity  . Alcohol use: Not Currently  . Drug use: Never  . Sexual activity: Not on file  Other Topics Concern  . Not on file  Social History Narrative  . Not on file   Social Determinants of Health   Financial Resource Strain: Not on file  Food Insecurity: Not on file  Transportation Needs: Not on file  Physical Activity: Not on file  Stress: Not on file  Social Connections: Not on file     Family History: The patient's family history includes Diabetes in her father; Heart attack in her father and mother; Lung cancer in her mother. ROS:   Please see the history of present illness.    All 14 point review of systems negative except as described per history of present illness  EKGs/Labs/Other Studies Reviewed:      Recent Labs: No results found for requested labs within last 8760 hours.  Recent Lipid Panel No results found for: CHOL, TRIG, HDL, CHOLHDL, VLDL, LDLCALC, LDLDIRECT  Physical Exam:    VS:  BP 134/70 (BP Location: Left Arm, Patient Position: Sitting)   Pulse (!) 46   Ht 5' (1.524 m)   Wt 152 lb (68.9 kg)   SpO2 (!) 81%   BMI 29.69 kg/m     Wt Readings from Last 3 Encounters:  08/31/20 152 lb (68.9 kg)  05/29/20 153 lb 3.2 oz (69.5 kg)  04/26/20 156 lb (70.8 kg)     GEN:  Well nourished, well developed in no acute distress HEENT: Normal NECK: No JVD; No carotid bruits LYMPHATICS: No lymphadenopathy CARDIAC: RRR, systolic ejection murmur grade 2/6 best heard in the right upper portion of the sternum, no rubs, no gallops RESPIRATORY:  Clear to auscultation without rales, wheezing or rhonchi  ABDOMEN: Soft, non-tender, non-distended MUSCULOSKELETAL:  No edema; No deformity  SKIN: Warm and dry LOWER EXTREMITIES: no swelling NEUROLOGIC:  Alert and oriented x 3 PSYCHIATRIC:  Normal affect   ASSESSMENT:    1. Mild aortic valve stenosis   2. Sinus bradycardia   3. Left bundle  branch block   4. Essential hypertension   5. Left bundle branch block (LBBB) on electrocardiogram    PLAN:    In order of problems listed above:  1. Moderate aortic valve stenosis.  Echocardiogram will be repeated in February. 2. Sinus bradycardia with some nocturnal pauses, mechanism of this phenomenon is unclear.  She was referred to EP team concern and conclusion was that we need to wait for any symptoms before proceeding with pacing.  Patient is not too eager about having pacemaker.  Therefore, I warned him about potential signs and symptoms of problem which include  dizziness or passing out.  And she will let us know if she developed her symptoms. 3. Essential hypertension appears to be well controlled continue present management. 4. Left bundle branch block is a chronic phenomenon noted unchanged. 5. Dyslipidemia I did review her K PN from 05/28/2020 showing LDL of 51 HDL 39 does have good numbers.  We will continue present management which include Crestor and fatty acids.   Medication Adjustments/Labs and Tests Ordered: Current medicines are reviewed at length with the patient today.  Concerns regarding medicines are outlined above.  No orders of the defined types were placed in this encounter.  Medication changes: No orders of the defined types were placed in this encounter.   Signed, Park Liter, MD, [facility] 08/31/2020 9:51 AM    [place]

## 2020-08-31 NOTE — Addendum Note (Signed)
Addended by: Darrel Reach on: 08/31/2020 10:18 AM   Modules accepted: Orders

## 2020-08-31 NOTE — Patient Instructions (Signed)
Medication Instructions:  Your physician recommends that you continue on your current medications as directed. Please refer to the Current Medication list given to you today.  *If you need a refill on your cardiac medications before your next appointment, please call your pharmacy*   Lab Work: none If you have labs (blood work) drawn today and your tests are completely normal, you will receive your results only by: Marland Kitchen MyChart Message (if you have MyChart) OR . A paper copy in the mail If you have any lab test that is abnormal or we need to change your treatment, we will call you to review the results.   Testing/Procedures: Your physician has requested that you have an echocardiogram. Echocardiography is a painless test that uses sound waves to create images of your heart. It provides your doctor with information about the size and shape of your heart and how well your heart's chambers and valves are working. This procedure takes approximately one hour. There are no restrictions for this procedure. In February   Follow-Up: At [facility], you and your health needs are our priority.  As part of our continuing mission to provide you with exceptional heart care, we have created designated Provider Care Teams.  These Care Teams include your primary Cardiologist (physician) and Advanced Practice Providers (APPs -  Physician Assistants and Nurse Practitioners) who all work together to provide you with the care you need, when you need it.  We recommend signing up for the patient portal called "MyChart".  Sign up information is provided on this After Visit Summary.  MyChart is used to connect with patients for Virtual Visits (Telemedicine).  Patients are able to view lab/test results, encounter notes, upcoming appointments, etc.  Non-urgent messages can be sent to your provider as well.   To learn more about what you can do with MyChart, go to [email or web address].    Your next appointment:   6  month(s)  The format for your next appointment:   In Person  Provider:   Jenne Campus, MD   Other Instructions

## 2020-09-11 DIAGNOSIS — Z1331 Encounter for screening for depression: Secondary | ICD-10-CM | POA: Diagnosis not present

## 2020-09-11 DIAGNOSIS — Z0000 Encounter for general adult medical examination without abnormal findings: Secondary | ICD-10-CM | POA: Diagnosis not present

## 2020-09-11 DIAGNOSIS — E785 Hyperlipidemia, unspecified: Secondary | ICD-10-CM | POA: Diagnosis not present

## 2020-09-11 DIAGNOSIS — Z9181 History of falling: Secondary | ICD-10-CM | POA: Diagnosis not present

## 2020-09-19 DIAGNOSIS — N8111 Cystocele, midline: Secondary | ICD-10-CM | POA: Diagnosis not present

## 2020-10-10 ENCOUNTER — Other Ambulatory Visit: Payer: Medicare Other

## 2020-10-18 DIAGNOSIS — D539 Nutritional anemia, unspecified: Secondary | ICD-10-CM | POA: Diagnosis not present

## 2020-10-18 DIAGNOSIS — R7303 Prediabetes: Secondary | ICD-10-CM | POA: Diagnosis not present

## 2020-10-18 DIAGNOSIS — M810 Age-related osteoporosis without current pathological fracture: Secondary | ICD-10-CM | POA: Diagnosis not present

## 2020-10-18 DIAGNOSIS — E785 Hyperlipidemia, unspecified: Secondary | ICD-10-CM | POA: Diagnosis not present

## 2020-10-18 DIAGNOSIS — I129 Hypertensive chronic kidney disease with stage 1 through stage 4 chronic kidney disease, or unspecified chronic kidney disease: Secondary | ICD-10-CM | POA: Diagnosis not present

## 2020-10-18 DIAGNOSIS — N183 Chronic kidney disease, stage 3 unspecified: Secondary | ICD-10-CM | POA: Diagnosis not present

## 2020-10-18 DIAGNOSIS — M109 Gout, unspecified: Secondary | ICD-10-CM | POA: Diagnosis not present

## 2020-10-18 DIAGNOSIS — I350 Nonrheumatic aortic (valve) stenosis: Secondary | ICD-10-CM | POA: Diagnosis not present

## 2020-10-18 DIAGNOSIS — N814 Uterovaginal prolapse, unspecified: Secondary | ICD-10-CM | POA: Diagnosis not present

## 2020-10-18 DIAGNOSIS — Z6828 Body mass index (BMI) 28.0-28.9, adult: Secondary | ICD-10-CM | POA: Diagnosis not present

## 2020-11-05 DIAGNOSIS — L578 Other skin changes due to chronic exposure to nonionizing radiation: Secondary | ICD-10-CM | POA: Diagnosis not present

## 2020-11-05 DIAGNOSIS — L570 Actinic keratosis: Secondary | ICD-10-CM | POA: Diagnosis not present

## 2020-11-05 DIAGNOSIS — D485 Neoplasm of uncertain behavior of skin: Secondary | ICD-10-CM | POA: Diagnosis not present

## 2020-11-05 DIAGNOSIS — L821 Other seborrheic keratosis: Secondary | ICD-10-CM | POA: Diagnosis not present

## 2020-11-08 DIAGNOSIS — L98499 Non-pressure chronic ulcer of skin of other sites with unspecified severity: Secondary | ICD-10-CM | POA: Diagnosis not present

## 2020-11-19 DIAGNOSIS — D485 Neoplasm of uncertain behavior of skin: Secondary | ICD-10-CM | POA: Diagnosis not present

## 2020-11-19 DIAGNOSIS — L570 Actinic keratosis: Secondary | ICD-10-CM | POA: Diagnosis not present

## 2020-11-26 DIAGNOSIS — H401132 Primary open-angle glaucoma, bilateral, moderate stage: Secondary | ICD-10-CM | POA: Diagnosis not present

## 2020-11-27 ENCOUNTER — Other Ambulatory Visit: Payer: Self-pay

## 2020-11-27 ENCOUNTER — Ambulatory Visit (INDEPENDENT_AMBULATORY_CARE_PROVIDER_SITE_OTHER): Payer: Medicare Other | Admitting: Sports Medicine

## 2020-11-27 ENCOUNTER — Encounter: Payer: Self-pay | Admitting: Sports Medicine

## 2020-11-27 DIAGNOSIS — L84 Corns and callosities: Secondary | ICD-10-CM

## 2020-11-27 DIAGNOSIS — G588 Other specified mononeuropathies: Secondary | ICD-10-CM

## 2020-11-27 DIAGNOSIS — M79671 Pain in right foot: Secondary | ICD-10-CM | POA: Diagnosis not present

## 2020-11-27 DIAGNOSIS — M79672 Pain in left foot: Secondary | ICD-10-CM

## 2020-11-27 DIAGNOSIS — R7303 Prediabetes: Secondary | ICD-10-CM | POA: Diagnosis not present

## 2020-11-27 NOTE — Progress Notes (Signed)
Subjective: Molly Ortega is a 87 y.o. female patient who presents to office for follow up callus care. Reports that her callus is doing well.  No pain right now in both feet.  Patient is assisted by daughter this visit.     Patient Active Problem List   Diagnosis Date Noted  . Rosacea   . Pre-diabetes   . Peripheral neuropathy   . Mild aortic valve stenosis   . Left bundle branch block (LBBB) on electrocardiogram   . Hepatic steatosis   . Glaucoma   . Chronic kidney disease (CKD), stage III (moderate) (HCC)   . Chronic cough   . Arthritis   . Sinus bradycardia 03/06/2020  . Neuropathy 02/17/2020  . Rotator cuff tear arthropathy, right 02/02/2019  . Spinal stenosis of lumbar region with neurogenic claudication 09/01/2018  . Left bundle branch block 07/08/2018  . Hyperlipidemia 05/19/2018  . Essential hypertension 05/19/2018  . Hypertension 05/19/2018  . Chronic pain syndrome 01/06/2018  . Primary open angle glaucoma (POAG) of both eyes, moderate stage 11/30/2017  . Pseudophakia of both eyes 11/30/2017  . Paresthesias 09/25/2017    Current Outpatient Medications on File Prior to Visit  Medication Sig Dispense Refill  . allopurinol (ZYLOPRIM) 100 MG tablet Take 100 mg by mouth daily.     . Brinzolamide-Brimonidine (SIMBRINZA) 1-0.2 % SUSP INSTILL 1 DROP INTO EACH EYE TWICE DAILY    . denosumab (PROLIA) 60 MG/ML SOSY injection Inject 60 mg into the skin every 6 (six) months.    . hydrochlorothiazide (HYDRODIURIL) 25 MG tablet Take 25 mg by mouth daily.    Marland Kitchen icosapent Ethyl (VASCEPA) 1 g capsule Take 2 g by mouth 2 (two) times daily.    Marland Kitchen latanoprost (XALATAN) 0.005 % ophthalmic solution Apply 1 drop to eye at bedtime.     . lidocaine (XYLOCAINE) 5 % ointment Apply 1 application topically as needed. For foot pain 35.44 g 0  . losartan (COZAAR) 100 MG tablet Take 100 mg by mouth daily.     . magnesium oxide (MAG-OX) 400 MG tablet Take 400 mg by mouth daily.     . Multiple  Vitamin (MULTI-VITAMINS) TABS Take 1 tablet by mouth daily.     . niacin 100 MG tablet Take 100 mg by mouth daily.     . Omega-3 1000 MG CAPS Take 1,000 mg by mouth daily.     . Potassium 99 MG TABS Take 99 mg by mouth daily.     . rosuvastatin (CRESTOR) 20 MG tablet Take 20 mg by mouth daily.     No current facility-administered medications on file prior to visit.    Allergies  Allergen Reactions  . Lyrica [Pregabalin]     dizziness    Objective:  General: Alert and oriented x3 in no acute distress  Dermatology: Keratotic lesion present 5th met base on left>right and left 4th toe with skin lines transversing the lesion, pain is present with direct pressure to the lesion with a central nucleated core noted, no webspace macerations, no ecchymosis bilateral, all nails x 10 are thick and discolored but well manicured.  Patient goes for pedicures..  Vascular: Dorsalis Pedis and Posterior Tibial pedal pulses 1/4, Capillary Fill Time 5 seconds, + scant pedal hair growth bilateral, no edema bilateral lower extremities, Temperature gradient within normal limits.  Neurology: Johney Maine sensation intact via light touch bilateral.  Musculoskeletal: Mild tenderness with palpation at the keratotic lesion site on left>right>left 4th toe, severely limited first MPJ range of  motion right greater than left with dorsal bony exostosis and mild digital contracture, there is limited midtarsal joint range of motion was not supportive of arthritis left and right foot, muscular strength 4/5 in all groups without major pain on range of motion however limited as above.  Pes planus foot type.  Assessment and Plan: Problem List Items Addressed This Visit      Nervous and Auditory   Peripheral neuropathy     Other   Pre-diabetes    Other Visit Diagnoses    Callus of foot    -  Primary   Foot pain, bilateral          -Complete examination performed -Discussed treatment options for callus -Parred keratoic  lesion using a chisel blade x3 w/o incident  -Continue with foot miracle cream  -Advised good supportive shoes and inserts like previous -Recommend vicks for nails however patient has failed to try this and advised daughter who is present that patient can try this to try to help with the thickness on her toenails as well as continue with getting pedicures -Patient to return to office in 12-16 weeks for callus follow-up or sooner if condition worsens.  Landis Martins, DPM

## 2020-12-20 DIAGNOSIS — M810 Age-related osteoporosis without current pathological fracture: Secondary | ICD-10-CM | POA: Diagnosis not present

## 2020-12-26 DIAGNOSIS — R159 Full incontinence of feces: Secondary | ICD-10-CM | POA: Diagnosis not present

## 2020-12-26 DIAGNOSIS — N813 Complete uterovaginal prolapse: Secondary | ICD-10-CM | POA: Diagnosis not present

## 2020-12-26 DIAGNOSIS — N3281 Overactive bladder: Secondary | ICD-10-CM | POA: Diagnosis not present

## 2020-12-26 DIAGNOSIS — R54 Age-related physical debility: Secondary | ICD-10-CM | POA: Diagnosis not present

## 2020-12-26 DIAGNOSIS — D509 Iron deficiency anemia, unspecified: Secondary | ICD-10-CM | POA: Diagnosis not present

## 2020-12-26 DIAGNOSIS — N958 Other specified menopausal and perimenopausal disorders: Secondary | ICD-10-CM | POA: Diagnosis not present

## 2021-01-15 DIAGNOSIS — N814 Uterovaginal prolapse, unspecified: Secondary | ICD-10-CM | POA: Diagnosis not present

## 2021-01-15 DIAGNOSIS — M109 Gout, unspecified: Secondary | ICD-10-CM | POA: Diagnosis not present

## 2021-01-15 DIAGNOSIS — N183 Chronic kidney disease, stage 3 unspecified: Secondary | ICD-10-CM | POA: Diagnosis not present

## 2021-01-15 DIAGNOSIS — E785 Hyperlipidemia, unspecified: Secondary | ICD-10-CM | POA: Diagnosis not present

## 2021-01-15 DIAGNOSIS — I129 Hypertensive chronic kidney disease with stage 1 through stage 4 chronic kidney disease, or unspecified chronic kidney disease: Secondary | ICD-10-CM | POA: Diagnosis not present

## 2021-01-15 DIAGNOSIS — I350 Nonrheumatic aortic (valve) stenosis: Secondary | ICD-10-CM | POA: Diagnosis not present

## 2021-01-15 DIAGNOSIS — Z6828 Body mass index (BMI) 28.0-28.9, adult: Secondary | ICD-10-CM | POA: Diagnosis not present

## 2021-01-15 DIAGNOSIS — R7303 Prediabetes: Secondary | ICD-10-CM | POA: Diagnosis not present

## 2021-01-15 DIAGNOSIS — M810 Age-related osteoporosis without current pathological fracture: Secondary | ICD-10-CM | POA: Diagnosis not present

## 2021-02-18 DIAGNOSIS — L570 Actinic keratosis: Secondary | ICD-10-CM | POA: Diagnosis not present

## 2021-03-05 ENCOUNTER — Ambulatory Visit (INDEPENDENT_AMBULATORY_CARE_PROVIDER_SITE_OTHER): Payer: Medicare Other | Admitting: Sports Medicine

## 2021-03-05 ENCOUNTER — Encounter: Payer: Self-pay | Admitting: Sports Medicine

## 2021-03-05 ENCOUNTER — Other Ambulatory Visit: Payer: Self-pay

## 2021-03-05 DIAGNOSIS — M79672 Pain in left foot: Secondary | ICD-10-CM

## 2021-03-05 DIAGNOSIS — L84 Corns and callosities: Secondary | ICD-10-CM

## 2021-03-05 DIAGNOSIS — R7303 Prediabetes: Secondary | ICD-10-CM

## 2021-03-05 DIAGNOSIS — M79671 Pain in right foot: Secondary | ICD-10-CM | POA: Diagnosis not present

## 2021-03-05 NOTE — Progress Notes (Signed)
Subjective: Molly Ortega is a 87 y.o. female patient who presents to office for follow up callus care. Reports some soreness to left callus.  No other pedal complaints noted.  Patient is assisted by daughter this visit.     Patient Active Problem List   Diagnosis Date Noted   Rosacea    Pre-diabetes    Peripheral neuropathy    Mild aortic valve stenosis    Left bundle branch block (LBBB) on electrocardiogram    Hepatic steatosis    Glaucoma    Chronic kidney disease (CKD), stage III (moderate) (HCC)    Chronic cough    Arthritis    Sinus bradycardia 03/06/2020   Neuropathy 02/17/2020   Rotator cuff tear arthropathy, right 02/02/2019   Spinal stenosis of lumbar region with neurogenic claudication 09/01/2018   Left bundle branch block 07/08/2018   Hyperlipidemia 05/19/2018   Essential hypertension 05/19/2018   Hypertension 05/19/2018   Chronic pain syndrome 01/06/2018   Primary open angle glaucoma (POAG) of both eyes, moderate stage 11/30/2017   Pseudophakia of both eyes 11/30/2017   Paresthesias 09/25/2017    Current Outpatient Medications on File Prior to Visit  Medication Sig Dispense Refill   allopurinol (ZYLOPRIM) 100 MG tablet Take 100 mg by mouth daily.      Brinzolamide-Brimonidine (SIMBRINZA) 1-0.2 % SUSP INSTILL 1 DROP INTO EACH EYE TWICE DAILY     denosumab (PROLIA) 60 MG/ML SOSY injection Inject 60 mg into the skin every 6 (six) months.     hydrochlorothiazide (HYDRODIURIL) 25 MG tablet Take 25 mg by mouth daily.     icosapent Ethyl (VASCEPA) 1 g capsule Take 2 g by mouth 2 (two) times daily.     latanoprost (XALATAN) 0.005 % ophthalmic solution Apply 1 drop to eye at bedtime.      lidocaine (XYLOCAINE) 5 % ointment Apply 1 application topically as needed. For foot pain 35.44 g 0   losartan (COZAAR) 100 MG tablet Take 100 mg by mouth daily.      magnesium oxide (MAG-OX) 400 MG tablet Take 400 mg by mouth daily.      Multiple Vitamin (MULTI-VITAMINS) TABS  Take 1 tablet by mouth daily.      niacin 100 MG tablet Take 100 mg by mouth daily.      Omega-3 1000 MG CAPS Take 1,000 mg by mouth daily.      Potassium 99 MG TABS Take 99 mg by mouth daily.      rosuvastatin (CRESTOR) 20 MG tablet Take 20 mg by mouth daily.     No current facility-administered medications on file prior to visit.    Allergies  Allergen Reactions   Lyrica [Pregabalin]     dizziness    Objective:  General: Alert and oriented x3 in no acute distress  Dermatology: Keratotic lesion present 5th met base on left and left 4th toe with skin lines transversing the lesion, pain is present with direct pressure to the lesion with a central nucleated core noted, no webspace macerations, no ecchymosis bilateral, all nails x 10 are thick and discolored but well manicured.  Patient goes for pedicures..  Vascular: Dorsalis Pedis and Posterior Tibial pedal pulses 1/4, Capillary Fill Time 5 seconds, + scant pedal hair growth bilateral, no edema bilateral lower extremities, Temperature gradient within normal limits.  Neurology: Johney Maine sensation intact via light touch bilateral.  Musculoskeletal: Mild tenderness with palpation at the keratotic lesion site on left>left 4th toe, severely limited first MPJ range of motion right greater  than left with dorsal bony exostosis and mild digital contracture, there is limited midtarsal joint range of motion was not supportive of arthritis left and right foot, muscular strength 4/5 in all groups without major pain on range of motion however limited as above.  Pes planus foot type.  Assessment and Plan: Problem List Items Addressed This Visit       Other   Pre-diabetes   Other Visit Diagnoses     Callus of foot    -  Primary   Foot pain, bilateral           -Complete examination performed -Discussed treatment options for callus -Parred keratoic lesion using a chisel blade x2 w/o incident  -Advised good supportive shoes and inserts like  previous -Patient to return to office in 12-16 weeks for callus follow-up or sooner if condition worsens.  Landis Martins, DPM

## 2021-03-25 DIAGNOSIS — H61001 Unspecified perichondritis of right external ear: Secondary | ICD-10-CM | POA: Diagnosis not present

## 2021-04-01 DIAGNOSIS — H401132 Primary open-angle glaucoma, bilateral, moderate stage: Secondary | ICD-10-CM | POA: Diagnosis not present

## 2021-04-03 DIAGNOSIS — Z4689 Encounter for fitting and adjustment of other specified devices: Secondary | ICD-10-CM | POA: Diagnosis not present

## 2021-04-03 DIAGNOSIS — N813 Complete uterovaginal prolapse: Secondary | ICD-10-CM | POA: Diagnosis not present

## 2021-04-25 DIAGNOSIS — H401132 Primary open-angle glaucoma, bilateral, moderate stage: Secondary | ICD-10-CM | POA: Diagnosis not present

## 2021-05-29 ENCOUNTER — Other Ambulatory Visit: Payer: Self-pay

## 2021-05-29 ENCOUNTER — Ambulatory Visit (INDEPENDENT_AMBULATORY_CARE_PROVIDER_SITE_OTHER): Payer: Medicare Other | Admitting: Sports Medicine

## 2021-05-29 ENCOUNTER — Encounter: Payer: Self-pay | Admitting: Sports Medicine

## 2021-05-29 DIAGNOSIS — L84 Corns and callosities: Secondary | ICD-10-CM | POA: Diagnosis not present

## 2021-05-29 DIAGNOSIS — R7303 Prediabetes: Secondary | ICD-10-CM

## 2021-05-29 DIAGNOSIS — M79672 Pain in left foot: Secondary | ICD-10-CM

## 2021-05-29 DIAGNOSIS — G588 Other specified mononeuropathies: Secondary | ICD-10-CM

## 2021-05-29 DIAGNOSIS — M79671 Pain in right foot: Secondary | ICD-10-CM

## 2021-05-29 NOTE — Progress Notes (Signed)
Subjective: Molly Ortega is a 87 y.o. female patient who presents to office for follow up callus care. Reports some soreness to left callus and at the fourth toe.  No other pedal complaints noted.  Patient is assisted by daughter this visit.     Patient Active Problem List   Diagnosis Date Noted   Rosacea    Pre-diabetes    Peripheral neuropathy    Mild aortic valve stenosis    Left bundle branch block (LBBB) on electrocardiogram    Hepatic steatosis    Glaucoma    Chronic kidney disease (CKD), stage III (moderate) (HCC)    Chronic cough    Arthritis    Sinus bradycardia 03/06/2020   Neuropathy 02/17/2020   Rotator cuff tear arthropathy, right 02/02/2019   Spinal stenosis of lumbar region with neurogenic claudication 09/01/2018   Left bundle branch block 07/08/2018   Hyperlipidemia 05/19/2018   Essential hypertension 05/19/2018   Hypertension 05/19/2018   Chronic pain syndrome 01/06/2018   Primary open angle glaucoma (POAG) of both eyes, moderate stage 11/30/2017   Pseudophakia of both eyes 11/30/2017   Paresthesias 09/25/2017    Current Outpatient Medications on File Prior to Visit  Medication Sig Dispense Refill   allopurinol (ZYLOPRIM) 100 MG tablet Take 100 mg by mouth daily.      Brinzolamide-Brimonidine (SIMBRINZA) 1-0.2 % SUSP INSTILL 1 DROP INTO EACH EYE TWICE DAILY     denosumab (PROLIA) 60 MG/ML SOSY injection Inject 60 mg into the skin every 6 (six) months.     hydrochlorothiazide (HYDRODIURIL) 25 MG tablet Take 25 mg by mouth daily.     icosapent Ethyl (VASCEPA) 1 g capsule Take 2 g by mouth 2 (two) times daily.     latanoprost (XALATAN) 0.005 % ophthalmic solution Apply 1 drop to eye at bedtime.      lidocaine (XYLOCAINE) 5 % ointment Apply 1 application topically as needed. For foot pain 35.44 g 0   losartan (COZAAR) 100 MG tablet Take 100 mg by mouth daily.      magnesium oxide (MAG-OX) 400 MG tablet Take 400 mg by mouth daily.      Multiple Vitamin  (MULTI-VITAMINS) TABS Take 1 tablet by mouth daily.      niacin 100 MG tablet Take 100 mg by mouth daily.      Omega-3 1000 MG CAPS Take 1,000 mg by mouth daily.      Potassium 99 MG TABS Take 99 mg by mouth daily.      rosuvastatin (CRESTOR) 20 MG tablet Take 20 mg by mouth daily.     triamcinolone ointment (KENALOG) 0.5 % Apply topically.     No current facility-administered medications on file prior to visit.    Allergies  Allergen Reactions   Lyrica [Pregabalin]     dizziness    Objective:  General: Alert and oriented x3 in no acute distress  Dermatology: Keratotic lesion present 5th met base on left and left 4th toe with skin lines transversing the lesion, pain is present with direct pressure to the lesion with a central nucleated core noted, no webspace macerations, no ecchymosis bilateral, all nails x 10 are thick and discolored mildly elongated but well manicured/polished.  Patient goes for pedicures..  Vascular: Dorsalis Pedis and Posterior Tibial pedal pulses 1/4, Capillary Fill Time 5 seconds, + scant pedal hair growth bilateral, no edema bilateral lower extremities, Temperature gradient within normal limits.  Neurology: Gross sensation intact via light touch bilateral.  Musculoskeletal: Mild tenderness with palpation  at the keratotic lesion site on left>left 4th toe, severely limited first MPJ range of motion right greater than left with dorsal bony exostosis and mild digital contracture, there is limited midtarsal joint range of motion was not supportive of arthritis left and right foot, muscular strength 4/5 in all groups without major pain on range of motion however limited as above.  Pes planus foot type.  Assessment and Plan: Problem List Items Addressed This Visit       Nervous and Auditory   Peripheral neuropathy     Other   Pre-diabetes   Other Visit Diagnoses     Callus of foot    -  Primary   Foot pain, bilateral           -Complete examination  performed -Discussed treatment options for callus -Parred keratoic lesion using a chisel blade x2.  There was a little small amount of iatrogenic bleeding at the left fourth toe that was treated with Lumican -At no additional charge mechanically debrided nails x10 using a sterile nail nipper without incident -Patient to return to office in 12-16 weeks for callus follow-up or sooner if condition worsens.  Landis Martins, DPM

## 2021-06-12 DIAGNOSIS — I350 Nonrheumatic aortic (valve) stenosis: Secondary | ICD-10-CM | POA: Diagnosis not present

## 2021-06-12 DIAGNOSIS — R634 Abnormal weight loss: Secondary | ICD-10-CM | POA: Diagnosis not present

## 2021-06-12 DIAGNOSIS — M109 Gout, unspecified: Secondary | ICD-10-CM | POA: Diagnosis not present

## 2021-06-12 DIAGNOSIS — E785 Hyperlipidemia, unspecified: Secondary | ICD-10-CM | POA: Diagnosis not present

## 2021-06-12 DIAGNOSIS — R7303 Prediabetes: Secondary | ICD-10-CM | POA: Diagnosis not present

## 2021-06-12 DIAGNOSIS — N814 Uterovaginal prolapse, unspecified: Secondary | ICD-10-CM | POA: Diagnosis not present

## 2021-06-12 DIAGNOSIS — Z139 Encounter for screening, unspecified: Secondary | ICD-10-CM | POA: Diagnosis not present

## 2021-06-12 DIAGNOSIS — I129 Hypertensive chronic kidney disease with stage 1 through stage 4 chronic kidney disease, or unspecified chronic kidney disease: Secondary | ICD-10-CM | POA: Diagnosis not present

## 2021-06-12 DIAGNOSIS — Z1239 Encounter for other screening for malignant neoplasm of breast: Secondary | ICD-10-CM | POA: Diagnosis not present

## 2021-06-12 DIAGNOSIS — N183 Chronic kidney disease, stage 3 unspecified: Secondary | ICD-10-CM | POA: Diagnosis not present

## 2021-06-12 DIAGNOSIS — M810 Age-related osteoporosis without current pathological fracture: Secondary | ICD-10-CM | POA: Diagnosis not present

## 2021-06-12 DIAGNOSIS — D649 Anemia, unspecified: Secondary | ICD-10-CM | POA: Diagnosis not present

## 2021-06-27 DIAGNOSIS — M810 Age-related osteoporosis without current pathological fracture: Secondary | ICD-10-CM | POA: Diagnosis not present

## 2021-07-11 DIAGNOSIS — N765 Ulceration of vagina: Secondary | ICD-10-CM | POA: Diagnosis not present

## 2021-07-11 DIAGNOSIS — R8290 Unspecified abnormal findings in urine: Secondary | ICD-10-CM | POA: Diagnosis not present

## 2021-07-11 DIAGNOSIS — Z960 Presence of urogenital implants: Secondary | ICD-10-CM | POA: Diagnosis not present

## 2021-07-24 DIAGNOSIS — N814 Uterovaginal prolapse, unspecified: Secondary | ICD-10-CM | POA: Diagnosis not present

## 2021-07-24 DIAGNOSIS — N3000 Acute cystitis without hematuria: Secondary | ICD-10-CM | POA: Diagnosis not present

## 2021-07-24 DIAGNOSIS — N3281 Overactive bladder: Secondary | ICD-10-CM | POA: Diagnosis not present

## 2021-07-24 DIAGNOSIS — N952 Postmenopausal atrophic vaginitis: Secondary | ICD-10-CM | POA: Diagnosis not present

## 2021-07-24 DIAGNOSIS — R3915 Urgency of urination: Secondary | ICD-10-CM | POA: Diagnosis not present

## 2021-07-24 DIAGNOSIS — Z4689 Encounter for fitting and adjustment of other specified devices: Secondary | ICD-10-CM | POA: Diagnosis not present

## 2021-07-24 DIAGNOSIS — B9620 Unspecified Escherichia coli [E. coli] as the cause of diseases classified elsewhere: Secondary | ICD-10-CM | POA: Diagnosis not present

## 2021-08-28 ENCOUNTER — Ambulatory Visit: Payer: Medicare Other | Admitting: Sports Medicine

## 2021-09-03 DIAGNOSIS — L219 Seborrheic dermatitis, unspecified: Secondary | ICD-10-CM | POA: Diagnosis not present

## 2021-09-03 DIAGNOSIS — L570 Actinic keratosis: Secondary | ICD-10-CM | POA: Diagnosis not present

## 2021-09-06 ENCOUNTER — Other Ambulatory Visit: Payer: Self-pay

## 2021-09-06 ENCOUNTER — Ambulatory Visit (INDEPENDENT_AMBULATORY_CARE_PROVIDER_SITE_OTHER): Payer: Medicare Other | Admitting: Sports Medicine

## 2021-09-06 DIAGNOSIS — B351 Tinea unguium: Secondary | ICD-10-CM

## 2021-09-06 DIAGNOSIS — M79672 Pain in left foot: Secondary | ICD-10-CM

## 2021-09-06 DIAGNOSIS — M79671 Pain in right foot: Secondary | ICD-10-CM

## 2021-09-06 DIAGNOSIS — M79675 Pain in left toe(s): Secondary | ICD-10-CM

## 2021-09-06 DIAGNOSIS — L84 Corns and callosities: Secondary | ICD-10-CM

## 2021-09-06 DIAGNOSIS — R7303 Prediabetes: Secondary | ICD-10-CM

## 2021-09-06 DIAGNOSIS — G588 Other specified mononeuropathies: Secondary | ICD-10-CM

## 2021-09-06 DIAGNOSIS — M79674 Pain in right toe(s): Secondary | ICD-10-CM

## 2021-09-06 NOTE — Progress Notes (Signed)
Subjective: Molly Ortega is a 88 y.o. female patient who presents to office for follow up callus and nail care.  No other pedal complaints noted.  Patient is assisted by daughter this visit.     Patient Active Problem List   Diagnosis Date Noted   Rosacea    Pre-diabetes    Peripheral neuropathy    Mild aortic valve stenosis    Left bundle branch block (LBBB) on electrocardiogram    Hepatic steatosis    Glaucoma    Chronic kidney disease (CKD), stage III (moderate) (HCC)    Chronic cough    Arthritis    Sinus bradycardia 03/06/2020   Neuropathy 02/17/2020   Rotator cuff tear arthropathy, right 02/02/2019   Spinal stenosis of lumbar region with neurogenic claudication 09/01/2018   Left bundle branch block 07/08/2018   Hyperlipidemia 05/19/2018   Essential hypertension 05/19/2018   Hypertension 05/19/2018   Chronic pain syndrome 01/06/2018   Primary open angle glaucoma (POAG) of both eyes, moderate stage 11/30/2017   Pseudophakia of both eyes 11/30/2017   Paresthesias 09/25/2017    Current Outpatient Medications on File Prior to Visit  Medication Sig Dispense Refill   allopurinol (ZYLOPRIM) 100 MG tablet Take 100 mg by mouth daily.      Brinzolamide-Brimonidine (SIMBRINZA) 1-0.2 % SUSP INSTILL 1 DROP INTO EACH EYE TWICE DAILY     denosumab (PROLIA) 60 MG/ML SOSY injection Inject 60 mg into the skin every 6 (six) months.     hydrochlorothiazide (HYDRODIURIL) 25 MG tablet Take 25 mg by mouth daily.     icosapent Ethyl (VASCEPA) 1 g capsule Take 2 g by mouth 2 (two) times daily.     latanoprost (XALATAN) 0.005 % ophthalmic solution Apply 1 drop to eye at bedtime.      lidocaine (XYLOCAINE) 5 % ointment Apply 1 application topically as needed. For foot pain 35.44 g 0   losartan (COZAAR) 100 MG tablet Take 100 mg by mouth daily.      magnesium oxide (MAG-OX) 400 MG tablet Take 400 mg by mouth daily.      Multiple Vitamin (MULTI-VITAMINS) TABS Take 1 tablet by mouth daily.       niacin 100 MG tablet Take 100 mg by mouth daily.      Omega-3 1000 MG CAPS Take 1,000 mg by mouth daily.      Potassium 99 MG TABS Take 99 mg by mouth daily.      rosuvastatin (CRESTOR) 20 MG tablet Take 20 mg by mouth daily.     triamcinolone ointment (KENALOG) 0.5 % Apply topically.     No current facility-administered medications on file prior to visit.    Allergies  Allergen Reactions   Lyrica [Pregabalin]     dizziness    Objective:  General: Alert and oriented x3 in no acute distress  Dermatology: Keratotic lesion present 5th met base on left and left 4th toe and Right 5th MTPJ with skin lines transversing the lesion, pain is present with direct pressure to the lesion with a central nucleated core noted, no webspace macerations, no ecchymosis bilateral, all nails x 10 are thick and discolored elongated with mild subungal debris.   Vascular: Dorsalis Pedis and Posterior Tibial pedal pulses 1/4, Capillary Fill Time 5 seconds, + scant pedal hair growth bilateral, no edema bilateral lower extremities, Temperature gradient within normal limits.  Neurology: Johney Maine sensation intact via light touch bilateral.  Musculoskeletal: Mild tenderness with palpation at the keratotic lesion site on left 4th toe>Right  5th MTPJ, severely limited first MPJ range of motion right greater than left with dorsal bony exostosis and mild digital contracture, there is limited midtarsal joint range of motion was not supportive of arthritis left and right foot, muscular strength 4/5 in all groups without major pain on range of motion however limited as above.  Pes planus foot type.  Assessment and Plan: Problem List Items Addressed This Visit       Nervous and Auditory   Peripheral neuropathy     Other   Pre-diabetes   Other Visit Diagnoses     Callus of foot    -  Primary   Foot pain, bilateral           -Complete examination performed -Discussed treatment options for callus -Parred  keratoic lesion using a chisel blade x2 left and right foot without incident  -Mechanically debrided nails x10 using a sterile nail nipper without incident -Patient to return to office in 12-16 weeks for callus follow-up or sooner if condition worsens.  Landis Martins, DPM

## 2021-09-07 ENCOUNTER — Encounter: Payer: Self-pay | Admitting: Sports Medicine

## 2021-09-25 DIAGNOSIS — Z961 Presence of intraocular lens: Secondary | ICD-10-CM | POA: Diagnosis not present

## 2021-09-25 DIAGNOSIS — H401132 Primary open-angle glaucoma, bilateral, moderate stage: Secondary | ICD-10-CM | POA: Diagnosis not present

## 2021-09-25 DIAGNOSIS — H26491 Other secondary cataract, right eye: Secondary | ICD-10-CM | POA: Diagnosis not present

## 2021-11-05 DIAGNOSIS — N958 Other specified menopausal and perimenopausal disorders: Secondary | ICD-10-CM | POA: Insufficient documentation

## 2021-11-05 DIAGNOSIS — N813 Complete uterovaginal prolapse: Secondary | ICD-10-CM | POA: Insufficient documentation

## 2021-11-05 DIAGNOSIS — N39490 Overflow incontinence: Secondary | ICD-10-CM | POA: Insufficient documentation

## 2021-12-10 ENCOUNTER — Encounter: Payer: Self-pay | Admitting: Sports Medicine

## 2021-12-10 ENCOUNTER — Ambulatory Visit (INDEPENDENT_AMBULATORY_CARE_PROVIDER_SITE_OTHER): Payer: Medicare Other | Admitting: Sports Medicine

## 2021-12-10 DIAGNOSIS — B351 Tinea unguium: Secondary | ICD-10-CM | POA: Diagnosis not present

## 2021-12-10 DIAGNOSIS — M79675 Pain in left toe(s): Secondary | ICD-10-CM

## 2021-12-10 DIAGNOSIS — I739 Peripheral vascular disease, unspecified: Secondary | ICD-10-CM | POA: Diagnosis not present

## 2021-12-10 DIAGNOSIS — M79674 Pain in right toe(s): Secondary | ICD-10-CM

## 2021-12-10 DIAGNOSIS — R7303 Prediabetes: Secondary | ICD-10-CM

## 2021-12-10 DIAGNOSIS — L84 Corns and callosities: Secondary | ICD-10-CM

## 2021-12-10 DIAGNOSIS — G588 Other specified mononeuropathies: Secondary | ICD-10-CM

## 2021-12-10 NOTE — Progress Notes (Signed)
?Subjective: ?Molly Ortega is a 88 y.o. female patient who presents to office for follow up callus and nail care.  No other pedal complaints noted. ? ?Patient is assisted by daughter this visit.  ? ? ? ?Patient Active Problem List  ? Diagnosis Date Noted  ? Genitourinary syndrome of menopause 11/05/2021  ? Overflow incontinence of urine 11/05/2021  ? Procidentia of uterus 11/05/2021  ? Rosacea   ? Pre-diabetes   ? Peripheral neuropathy   ? Mild aortic valve stenosis   ? Left bundle branch block (LBBB) on electrocardiogram   ? Hepatic steatosis   ? Glaucoma   ? Chronic kidney disease (CKD), stage III (moderate) (HCC)   ? Chronic cough   ? Arthritis   ? Sinus bradycardia 03/06/2020  ? Neuropathy 02/17/2020  ? Rotator cuff tear arthropathy, right 02/02/2019  ? Spinal stenosis of lumbar region with neurogenic claudication 09/01/2018  ? Left bundle branch block 07/08/2018  ? Hyperlipidemia 05/19/2018  ? Essential hypertension 05/19/2018  ? Hypertension 05/19/2018  ? Chronic pain syndrome 01/06/2018  ? Primary open angle glaucoma (POAG) of both eyes, moderate stage 11/30/2017  ? Pseudophakia of both eyes 11/30/2017  ? Paresthesias 09/25/2017  ? ? ?Current Outpatient Medications on File Prior to Visit  ?Medication Sig Dispense Refill  ? allopurinol (ZYLOPRIM) 100 MG tablet Take 100 mg by mouth daily.     ? aspirin 81 MG chewable tablet Chew 1 tablet by mouth daily.    ? Brinzolamide-Brimonidine (SIMBRINZA) 1-0.2 % SUSP INSTILL 1 DROP INTO EACH EYE TWICE DAILY    ? denosumab (PROLIA) 60 MG/ML SOSY injection Inject 60 mg into the skin every 6 (six) months.    ? hydrochlorothiazide (HYDRODIURIL) 25 MG tablet Take 25 mg by mouth daily.    ? icosapent Ethyl (VASCEPA) 1 g capsule Take 2 g by mouth 2 (two) times daily.    ? latanoprost (XALATAN) 0.005 % ophthalmic solution Apply 1 drop to eye at bedtime.     ? lidocaine (XYLOCAINE) 5 % ointment Apply 1 application topically as needed. For foot pain 35.44 g 0  ? losartan  (COZAAR) 100 MG tablet Take 100 mg by mouth daily.     ? magnesium oxide (MAG-OX) 400 MG tablet Take 400 mg by mouth daily.     ? Multiple Vitamin (MULTI-VITAMINS) TABS Take 1 tablet by mouth daily.     ? niacin 100 MG tablet Take 100 mg by mouth daily.     ? nitrofurantoin, macrocrystal-monohydrate, (MACROBID) 100 MG capsule Take 100 mg by mouth 2 (two) times daily.    ? nystatin cream (MYCOSTATIN) Apply topically 2 (two) times daily as needed.    ? Omega-3 1000 MG CAPS Take 1,000 mg by mouth daily.     ? Potassium 99 MG TABS Take 99 mg by mouth daily.     ? rosuvastatin (CRESTOR) 20 MG tablet Take 20 mg by mouth daily.    ? triamcinolone cream (KENALOG) 0.1 % Apply topically 2 (two) times daily as needed.    ? triamcinolone ointment (KENALOG) 0.5 % Apply topically.    ? ?No current facility-administered medications on file prior to visit.  ? ? ?Allergies  ?Allergen Reactions  ? Lyrica [Pregabalin]   ?  dizziness  ? ? ?Objective:  ?General: Alert and oriented x3 in no acute distress ? ?Dermatology: Keratotic lesion present 5th met base on left and left 4th toe and Right 5th MTPJ with skin lines transversing the lesion, pain is present with  direct pressure to the lesion with a central nucleated core noted, no webspace macerations, no ecchymosis bilateral, all nails x 10 are thick and discolored elongated with mild subungal debris.  ? ?Vascular: Dorsalis Pedis 1 out of 4 and Posterior Tibial pedal pulses 0/4, Capillary Fill Time 5 seconds, + scant pedal hair growth bilateral, no edema bilateral lower extremities, Temperature gradient within normal limits. ? ?Neurology: Gross sensation intact via light touch bilateral. ? ?Musculoskeletal: Mild tenderness with palpation at the keratotic lesion site on left 4th toe>Right 5th MTPJ, severely limited first MPJ range of motion right greater than left with dorsal bony exostosis and mild digital contracture, there is limited midtarsal joint range of motion was not supportive  of arthritis left and right foot, muscular strength 4/5 in all groups without major pain on range of motion however limited as above.  Pes planus foot type. ? ?Assessment and Plan: ?Problem List Items Addressed This Visit   ? ?  ? Nervous and Auditory  ? Peripheral neuropathy  ?  ? Other  ? Pre-diabetes  ? ?Other Visit Diagnoses   ? ? Pain due to onychomycosis of toenails of both feet    -  Primary  ? Relevant Medications  ? nitrofurantoin, macrocrystal-monohydrate, (MACROBID) 100 MG capsule  ? nystatin cream (MYCOSTATIN)  ? Callus of foot      ? PVD (peripheral vascular disease) ([place])      ? Relevant Medications  ? aspirin 81 MG chewable tablet  ? ?  ? ? ?-Complete examination performed ?-Discussed treatment options for callus ?-Parred keratoic lesion using a chisel blade x2 left and right foot without  ?-Encouraged daily skin emollients ?-Discussed treatment options for thickened painful nails ?-Mechanically debrided painful nails x10 using a sterile nail nipper without incident ?-Patient to return to office in 12-16 weeks for callus follow-up or sooner if condition worsens. ? ?Landis Martins, DPM ?

## 2021-12-31 DIAGNOSIS — L989 Disorder of the skin and subcutaneous tissue, unspecified: Secondary | ICD-10-CM | POA: Diagnosis not present

## 2021-12-31 DIAGNOSIS — L570 Actinic keratosis: Secondary | ICD-10-CM | POA: Diagnosis not present

## 2022-01-16 DIAGNOSIS — L818 Other specified disorders of pigmentation: Secondary | ICD-10-CM | POA: Diagnosis not present

## 2022-01-16 DIAGNOSIS — L989 Disorder of the skin and subcutaneous tissue, unspecified: Secondary | ICD-10-CM | POA: Diagnosis not present

## 2022-01-24 DIAGNOSIS — M109 Gout, unspecified: Secondary | ICD-10-CM | POA: Diagnosis not present

## 2022-01-24 DIAGNOSIS — M810 Age-related osteoporosis without current pathological fracture: Secondary | ICD-10-CM | POA: Diagnosis not present

## 2022-01-24 DIAGNOSIS — N183 Chronic kidney disease, stage 3 unspecified: Secondary | ICD-10-CM | POA: Diagnosis not present

## 2022-01-24 DIAGNOSIS — E785 Hyperlipidemia, unspecified: Secondary | ICD-10-CM | POA: Diagnosis not present

## 2022-01-24 DIAGNOSIS — I129 Hypertensive chronic kidney disease with stage 1 through stage 4 chronic kidney disease, or unspecified chronic kidney disease: Secondary | ICD-10-CM | POA: Diagnosis not present

## 2022-01-24 DIAGNOSIS — R011 Cardiac murmur, unspecified: Secondary | ICD-10-CM | POA: Diagnosis not present

## 2022-01-29 DIAGNOSIS — H26491 Other secondary cataract, right eye: Secondary | ICD-10-CM | POA: Diagnosis not present

## 2022-01-29 DIAGNOSIS — H401132 Primary open-angle glaucoma, bilateral, moderate stage: Secondary | ICD-10-CM | POA: Diagnosis not present

## 2022-01-29 DIAGNOSIS — Z961 Presence of intraocular lens: Secondary | ICD-10-CM | POA: Diagnosis not present

## 2022-02-21 DIAGNOSIS — N3281 Overactive bladder: Secondary | ICD-10-CM | POA: Diagnosis not present

## 2022-02-21 DIAGNOSIS — Z4689 Encounter for fitting and adjustment of other specified devices: Secondary | ICD-10-CM | POA: Diagnosis not present

## 2022-03-10 ENCOUNTER — Encounter: Payer: Self-pay | Admitting: Podiatry

## 2022-03-10 ENCOUNTER — Ambulatory Visit: Payer: Medicare Other | Admitting: Podiatry

## 2022-03-10 DIAGNOSIS — I739 Peripheral vascular disease, unspecified: Secondary | ICD-10-CM

## 2022-03-10 DIAGNOSIS — B351 Tinea unguium: Secondary | ICD-10-CM

## 2022-03-10 DIAGNOSIS — R7303 Prediabetes: Secondary | ICD-10-CM

## 2022-03-10 DIAGNOSIS — M79674 Pain in right toe(s): Secondary | ICD-10-CM

## 2022-03-10 DIAGNOSIS — L84 Corns and callosities: Secondary | ICD-10-CM

## 2022-03-10 DIAGNOSIS — M79675 Pain in left toe(s): Secondary | ICD-10-CM | POA: Diagnosis not present

## 2022-03-10 NOTE — Progress Notes (Signed)
Subjective: Molly Ortega is a 88 y.o. female patient who presents to office for follow up callus and nail care.  No other pedal complaints noted.  Patient is assisted by daughter this visit.     Patient Active Problem List   Diagnosis Date Noted   Genitourinary syndrome of menopause 11/05/2021   Overflow incontinence of urine 11/05/2021   Procidentia of uterus 11/05/2021   Rosacea    Pre-diabetes    Peripheral neuropathy    Mild aortic valve stenosis    Left bundle branch block (LBBB) on electrocardiogram    Hepatic steatosis    Glaucoma    Chronic kidney disease (CKD), stage III (moderate) (HCC)    Chronic cough    Arthritis    Sinus bradycardia 03/06/2020   Neuropathy 02/17/2020   Rotator cuff tear arthropathy, right 02/02/2019   Spinal stenosis of lumbar region with neurogenic claudication 09/01/2018   Left bundle branch block 07/08/2018   Hyperlipidemia 05/19/2018   Essential hypertension 05/19/2018   Hypertension 05/19/2018   Chronic pain syndrome 01/06/2018   Primary open angle glaucoma (POAG) of both eyes, moderate stage 11/30/2017   Pseudophakia of both eyes 11/30/2017   Paresthesias 09/25/2017    Current Outpatient Medications on File Prior to Visit  Medication Sig Dispense Refill   allopurinol (ZYLOPRIM) 100 MG tablet Take 100 mg by mouth daily.      aspirin 81 MG chewable tablet Chew 1 tablet by mouth daily.     Brinzolamide-Brimonidine (SIMBRINZA) 1-0.2 % SUSP INSTILL 1 DROP INTO EACH EYE TWICE DAILY     denosumab (PROLIA) 60 MG/ML SOSY injection Inject 60 mg into the skin every 6 (six) months.     hydrochlorothiazide (HYDRODIURIL) 25 MG tablet Take 25 mg by mouth daily.     icosapent Ethyl (VASCEPA) 1 g capsule Take 2 g by mouth 2 (two) times daily.     latanoprost (XALATAN) 0.005 % ophthalmic solution Apply 1 drop to eye at bedtime.      lidocaine (XYLOCAINE) 5 % ointment Apply 1 application topically as needed. For foot pain 35.44 g 0   losartan  (COZAAR) 100 MG tablet Take 100 mg by mouth daily.      magnesium oxide (MAG-OX) 400 MG tablet Take 400 mg by mouth daily.      Multiple Vitamin (MULTI-VITAMINS) TABS Take 1 tablet by mouth daily.      niacin 100 MG tablet Take 100 mg by mouth daily.      nitrofurantoin, macrocrystal-monohydrate, (MACROBID) 100 MG capsule Take 100 mg by mouth 2 (two) times daily.     nystatin cream (MYCOSTATIN) Apply topically 2 (two) times daily as needed.     Omega-3 1000 MG CAPS Take 1,000 mg by mouth daily.      Potassium 99 MG TABS Take 99 mg by mouth daily.      rosuvastatin (CRESTOR) 20 MG tablet Take 20 mg by mouth daily.     triamcinolone cream (KENALOG) 0.1 % Apply topically 2 (two) times daily as needed.     triamcinolone ointment (KENALOG) 0.5 % Apply topically.     No current facility-administered medications on file prior to visit.    Allergies  Allergen Reactions   Lyrica [Pregabalin]     dizziness    Objective:  General: Alert and oriented x3 in no acute distress  Dermatology: Keratotic lesion present 5th met base on left and left 4th toe and Right 5th MTPJ with skin lines transversing the lesion, pain is present with  direct pressure to the lesion with a central nucleated core noted, no webspace macerations, no ecchymosis bilateral, all nails x 10 are thick and discolored elongated with mild subungal debris.   Vascular: Dorsalis Pedis 1 out of 4 and Posterior Tibial pedal pulses 0/4, Capillary Fill Time 5 seconds, + scant pedal hair growth bilateral, no edema bilateral lower extremities, Temperature gradient within normal limits.  Neurology: Johney Maine sensation intact via light touch bilateral.  Musculoskeletal: Mild tenderness with palpation at the keratotic lesion site on left 4th toe>Right 5th MTPJ, severely limited first MPJ range of motion right greater than left with dorsal bony exostosis and mild digital contracture, there is limited midtarsal joint range of motion was not supportive  of arthritis left and right foot, muscular strength 4/5 in all groups without major pain on range of motion however limited as above.  Pes planus foot type.  Assessment and Plan: Problem List Items Addressed This Visit       Other   Pre-diabetes   Other Visit Diagnoses     Pain due to onychomycosis of toenails of both feet    -  Primary   Callus of foot       PVD (peripheral vascular disease) ([place])           -Complete examination performed -Discussed treatment options for callus -Parred keratoic lesion using a chisel blade x2 left and right foot without  -Encouraged daily skin emollients -Discussed treatment options for thickened painful nails -Mechanically debrided painful nails x10 using a sterile nail nipper without incident -Patient to return to office in 12-16 weeks for callus follow-up or sooner if condition worsens.  Lorenda Peck, DPM

## 2022-03-17 DIAGNOSIS — L818 Other specified disorders of pigmentation: Secondary | ICD-10-CM | POA: Diagnosis not present

## 2022-03-17 DIAGNOSIS — L989 Disorder of the skin and subcutaneous tissue, unspecified: Secondary | ICD-10-CM | POA: Diagnosis not present

## 2022-04-03 ENCOUNTER — Telehealth: Payer: Self-pay | Admitting: Podiatry

## 2022-04-03 ENCOUNTER — Other Ambulatory Visit: Payer: Self-pay | Admitting: Podiatry

## 2022-04-03 MED ORDER — LIDOCAINE 5 % EX OINT: 1.0000 | TOPICAL_OINTMENT | CUTANEOUS | 0 refills | Status: AC | PRN

## 2022-04-03 NOTE — Telephone Encounter (Signed)
Refill sent.

## 2022-04-03 NOTE — Telephone Encounter (Signed)
Called patient daughter Molly Ortega and let her know that medication was called in .

## 2022-04-03 NOTE — Telephone Encounter (Signed)
Pharmacy  -  [facility] in [place]  Medication  lidocaine (XYLOCAINE) 5 % ointment [[id number]]   Patient daughter called in requesting refill on this medication please advise

## 2022-04-09 DIAGNOSIS — Z01818 Encounter for other preprocedural examination: Secondary | ICD-10-CM | POA: Diagnosis not present

## 2022-04-09 DIAGNOSIS — N8110 Cystocele, unspecified: Secondary | ICD-10-CM | POA: Diagnosis not present

## 2022-04-15 DIAGNOSIS — I447 Left bundle-branch block, unspecified: Secondary | ICD-10-CM | POA: Diagnosis not present

## 2022-04-15 DIAGNOSIS — R001 Bradycardia, unspecified: Secondary | ICD-10-CM | POA: Diagnosis not present

## 2022-04-16 DIAGNOSIS — N3281 Overactive bladder: Secondary | ICD-10-CM | POA: Diagnosis not present

## 2022-04-21 DIAGNOSIS — M48061 Spinal stenosis, lumbar region without neurogenic claudication: Secondary | ICD-10-CM | POA: Diagnosis not present

## 2022-04-21 DIAGNOSIS — I350 Nonrheumatic aortic (valve) stenosis: Secondary | ICD-10-CM | POA: Diagnosis not present

## 2022-04-21 DIAGNOSIS — H9190 Unspecified hearing loss, unspecified ear: Secondary | ICD-10-CM | POA: Diagnosis not present

## 2022-04-21 DIAGNOSIS — R54 Age-related physical debility: Secondary | ICD-10-CM | POA: Diagnosis not present

## 2022-04-21 DIAGNOSIS — I10 Essential (primary) hypertension: Secondary | ICD-10-CM | POA: Diagnosis not present

## 2022-04-21 DIAGNOSIS — N3281 Overactive bladder: Secondary | ICD-10-CM | POA: Diagnosis not present

## 2022-04-21 DIAGNOSIS — E785 Hyperlipidemia, unspecified: Secondary | ICD-10-CM | POA: Diagnosis not present

## 2022-04-21 DIAGNOSIS — D649 Anemia, unspecified: Secondary | ICD-10-CM | POA: Diagnosis not present

## 2022-04-21 DIAGNOSIS — I447 Left bundle-branch block, unspecified: Secondary | ICD-10-CM | POA: Diagnosis not present

## 2022-04-21 DIAGNOSIS — Z79899 Other long term (current) drug therapy: Secondary | ICD-10-CM | POA: Diagnosis not present

## 2022-04-21 DIAGNOSIS — Z87891 Personal history of nicotine dependence: Secondary | ICD-10-CM | POA: Diagnosis not present

## 2022-04-21 DIAGNOSIS — K5900 Constipation, unspecified: Secondary | ICD-10-CM | POA: Diagnosis not present

## 2022-05-23 DIAGNOSIS — M810 Age-related osteoporosis without current pathological fracture: Secondary | ICD-10-CM | POA: Diagnosis not present

## 2022-05-23 DIAGNOSIS — R011 Cardiac murmur, unspecified: Secondary | ICD-10-CM | POA: Diagnosis not present

## 2022-05-23 DIAGNOSIS — I129 Hypertensive chronic kidney disease with stage 1 through stage 4 chronic kidney disease, or unspecified chronic kidney disease: Secondary | ICD-10-CM | POA: Diagnosis not present

## 2022-05-23 DIAGNOSIS — E785 Hyperlipidemia, unspecified: Secondary | ICD-10-CM | POA: Diagnosis not present

## 2022-05-23 DIAGNOSIS — M109 Gout, unspecified: Secondary | ICD-10-CM | POA: Diagnosis not present

## 2022-05-23 DIAGNOSIS — N183 Chronic kidney disease, stage 3 unspecified: Secondary | ICD-10-CM | POA: Diagnosis not present

## 2022-05-28 DIAGNOSIS — L570 Actinic keratosis: Secondary | ICD-10-CM | POA: Diagnosis not present

## 2022-05-28 DIAGNOSIS — L814 Other melanin hyperpigmentation: Secondary | ICD-10-CM | POA: Diagnosis not present

## 2022-05-28 DIAGNOSIS — L821 Other seborrheic keratosis: Secondary | ICD-10-CM | POA: Diagnosis not present

## 2022-05-28 DIAGNOSIS — L578 Other skin changes due to chronic exposure to nonionizing radiation: Secondary | ICD-10-CM | POA: Diagnosis not present

## 2022-05-28 DIAGNOSIS — D229 Melanocytic nevi, unspecified: Secondary | ICD-10-CM | POA: Diagnosis not present

## 2022-05-28 DIAGNOSIS — D239 Other benign neoplasm of skin, unspecified: Secondary | ICD-10-CM | POA: Diagnosis not present

## 2022-05-28 DIAGNOSIS — L720 Epidermal cyst: Secondary | ICD-10-CM | POA: Diagnosis not present

## 2022-05-28 DIAGNOSIS — L989 Disorder of the skin and subcutaneous tissue, unspecified: Secondary | ICD-10-CM | POA: Diagnosis not present

## 2022-06-02 DIAGNOSIS — Z961 Presence of intraocular lens: Secondary | ICD-10-CM | POA: Diagnosis not present

## 2022-06-02 DIAGNOSIS — H401132 Primary open-angle glaucoma, bilateral, moderate stage: Secondary | ICD-10-CM | POA: Diagnosis not present

## 2022-06-11 DIAGNOSIS — R319 Hematuria, unspecified: Secondary | ICD-10-CM | POA: Diagnosis not present

## 2022-06-11 DIAGNOSIS — R82998 Other abnormal findings in urine: Secondary | ICD-10-CM | POA: Diagnosis not present

## 2022-06-11 DIAGNOSIS — Z9889 Other specified postprocedural states: Secondary | ICD-10-CM | POA: Diagnosis not present

## 2022-06-19 ENCOUNTER — Ambulatory Visit: Payer: Medicare Other | Admitting: Podiatry

## 2022-06-19 ENCOUNTER — Encounter: Payer: Self-pay | Admitting: Podiatry

## 2022-06-19 DIAGNOSIS — M79675 Pain in left toe(s): Secondary | ICD-10-CM | POA: Diagnosis not present

## 2022-06-19 DIAGNOSIS — M79674 Pain in right toe(s): Secondary | ICD-10-CM | POA: Diagnosis not present

## 2022-06-19 DIAGNOSIS — G588 Other specified mononeuropathies: Secondary | ICD-10-CM

## 2022-06-19 DIAGNOSIS — B351 Tinea unguium: Secondary | ICD-10-CM

## 2022-06-19 DIAGNOSIS — I739 Peripheral vascular disease, unspecified: Secondary | ICD-10-CM

## 2022-06-19 DIAGNOSIS — L84 Corns and callosities: Secondary | ICD-10-CM

## 2022-06-19 NOTE — Progress Notes (Unsigned)
  Subjective:  Patient ID: Molly Ortega, female    DOB: Aug 03, 1931,  MRN: [id number]  POSEY JASMIN presents to clinic today for for at risk foot care. Patient has h/o PAD and callus(es) left lower extremity and painful thick toenails that are difficult to trim. Painful toenails interfere with ambulation. Aggravating factors include wearing enclosed shoe gear. Pain is relieved with periodic professional debridement. Painful calluses are aggravated when weightbearing with and without shoegear. Pain is relieved with periodic professional debridement.  Patient's daughter is present during today's visit. Chief Complaint  Patient presents with   Nail Problem    Nail Trim Prediabetic  Pt does not check BG, does not recall A1C PCP- Lilian Kapur , last OV May 23, 2022  . New problem(s): None.   Allergies  Allergen Reactions   Lyrica [Pregabalin]     dizziness   Review of Systems: Negative except as noted in the HPI.  Objective: No changes noted in today's physical examination.  SWAYZE KOZUCH is a pleasant 88 y.o. female WD, WN in NAD. AAO x 3.  Vascular Examination: CFT <5 seconds b/l. DP pulses faintly palpable b/l. PT pulses nonpalpable b/l. Digital hair absent. Skin temperature gradient warm to warm b/l. No pain with calf compression. No ischemia or gangrene. No cyanosis or clubbing noted b/l. No edema noted b/l LE.   Neurological Examination: Pt has subjective symptoms of neuropathy. Sensation grossly intact b/l with 10 gram monofilament. Vibratory sensation intact b/l.   Dermatological Examination: Pedal skin warm and supple b/l. Toenails 1-5 b/l thick, discolored, elongated with subungual debris and pain on dorsal palpation.  Incurvated nailplate medial border right hallux.  Nail border hypertrophy absent. There is tenderness to palpation. Sign(s) of infection: no clinical signs of infection noted on examination today. Hyperkeratotic lesion(s) dorsal PIPJ of L 4th toe  and sub 5th met base left lower extremity.  No erythema, no edema, no drainage, no fluctuance.  Musculoskeletal Examination: Muscle strength 4/5 to b/l LE. Limited joint ROM to the MPJ of 1st MPJ b/l. Pes planus deformity noted bilateral LE.  Radiographs: None  Assessment/Plan: 1. Pain due to onychomycosis of toenails of both feet   2. Corns and callosities   3. Other mononeuropathy   4. PVD (peripheral vascular disease) ([place])     No orders of the defined types were placed in this encounter.   -Patient's family member present. All questions/concerns addressed on today's visit. -Consent given for treatment as described below: -Patient to continue soft, supportive shoe gear daily. -Toenails 1-5 b/l were debrided in length and girth with sterile nail nippers and dremel without iatrogenic bleeding.  -Offending nail border debrided and curretaged medial border right hallux utilizing sterile nail nipper and currette. Border(s) cleansed with alcohol and TAO applied. Patient/POA/Caregiver/Facility instructed to apply Neosporin Cream  to right great toe once daily for 7 days. Call office if there are any concerns. -Corn(s) L 4th toe and callus(es) sub 5th met base left lower extremity were pared utilizing sterile scalpel blade without incident. Total number debrided =2. -Patient/POA to call should there be question/concern in the interim.   Return in about 3 months (around 09/19/2022).  Marzetta Board, DPM

## 2022-07-04 DIAGNOSIS — Z23 Encounter for immunization: Secondary | ICD-10-CM | POA: Diagnosis not present

## 2022-08-21 DIAGNOSIS — H6123 Impacted cerumen, bilateral: Secondary | ICD-10-CM | POA: Diagnosis not present

## 2022-10-07 DIAGNOSIS — L989 Disorder of the skin and subcutaneous tissue, unspecified: Secondary | ICD-10-CM | POA: Diagnosis not present

## 2022-10-07 DIAGNOSIS — L720 Epidermal cyst: Secondary | ICD-10-CM | POA: Diagnosis not present

## 2022-10-08 DIAGNOSIS — H401132 Primary open-angle glaucoma, bilateral, moderate stage: Secondary | ICD-10-CM | POA: Diagnosis not present

## 2022-10-08 DIAGNOSIS — Z961 Presence of intraocular lens: Secondary | ICD-10-CM | POA: Diagnosis not present

## 2022-10-09 ENCOUNTER — Ambulatory Visit: Payer: Medicare Other | Admitting: Podiatry

## 2022-10-15 ENCOUNTER — Encounter: Payer: Self-pay | Admitting: Podiatry

## 2022-10-15 ENCOUNTER — Ambulatory Visit: Payer: Medicare Other | Admitting: Podiatry

## 2022-10-15 DIAGNOSIS — B351 Tinea unguium: Secondary | ICD-10-CM

## 2022-10-15 DIAGNOSIS — M79675 Pain in left toe(s): Secondary | ICD-10-CM

## 2022-10-15 DIAGNOSIS — M79674 Pain in right toe(s): Secondary | ICD-10-CM

## 2022-10-15 DIAGNOSIS — I739 Peripheral vascular disease, unspecified: Secondary | ICD-10-CM

## 2022-10-15 DIAGNOSIS — L84 Corns and callosities: Secondary | ICD-10-CM | POA: Diagnosis not present

## 2022-10-15 NOTE — Progress Notes (Signed)
Subjective: Molly Ortega is a 89 y.o. female patient who presents to office for follow up callus and nail care.  No other pedal complaints noted.  Patient is assisted by daughter this visit.     Patient Active Problem List   Diagnosis Date Noted   Genitourinary syndrome of menopause 11/05/2021   Overflow incontinence of urine 11/05/2021   Procidentia of uterus 11/05/2021   Rosacea    Pre-diabetes    Peripheral neuropathy    Mild aortic valve stenosis    Left bundle branch block (LBBB) on electrocardiogram    Hepatic steatosis    Glaucoma    Chronic kidney disease (CKD), stage III (moderate) (HCC)    Chronic cough    Arthritis    Sinus bradycardia 03/06/2020   Neuropathy 02/17/2020   Rotator cuff tear arthropathy, right 02/02/2019   Spinal stenosis of lumbar region with neurogenic claudication 09/01/2018   Left bundle branch block 07/08/2018   Hyperlipidemia 05/19/2018   Essential hypertension 05/19/2018   Hypertension 05/19/2018   Chronic pain syndrome 01/06/2018   Primary open angle glaucoma (POAG) of both eyes, moderate stage 11/30/2017   Pseudophakia of both eyes 11/30/2017   Paresthesias 09/25/2017    Current Outpatient Medications on File Prior to Visit  Medication Sig Dispense Refill   allopurinol (ZYLOPRIM) 100 MG tablet Take 100 mg by mouth daily.      aspirin 81 MG chewable tablet Chew 1 tablet by mouth daily.     Brinzolamide-Brimonidine (SIMBRINZA) 1-0.2 % SUSP INSTILL 1 DROP INTO EACH EYE TWICE DAILY     denosumab (PROLIA) 60 MG/ML SOSY injection Inject 60 mg into the skin every 6 (six) months.     hydrochlorothiazide (HYDRODIURIL) 25 MG tablet Take 25 mg by mouth daily.     icosapent Ethyl (VASCEPA) 1 g capsule Take 2 g by mouth 2 (two) times daily.     latanoprost (XALATAN) 0.005 % ophthalmic solution Apply 1 drop to eye at bedtime.      lidocaine (XYLOCAINE) 5 % ointment Apply 1 Application topically as needed. For foot pain 35.44 g 0   losartan  (COZAAR) 100 MG tablet Take 100 mg by mouth daily.      magnesium oxide (MAG-OX) 400 MG tablet Take 400 mg by mouth daily.      Multiple Vitamin (MULTI-VITAMINS) TABS Take 1 tablet by mouth daily.      niacin 100 MG tablet Take 100 mg by mouth daily.      nitrofurantoin, macrocrystal-monohydrate, (MACROBID) 100 MG capsule Take 100 mg by mouth 2 (two) times daily.     nystatin cream (MYCOSTATIN) Apply topically 2 (two) times daily as needed.     Omega-3 1000 MG CAPS Take 1,000 mg by mouth daily.      Potassium 99 MG TABS Take 99 mg by mouth daily.      rosuvastatin (CRESTOR) 20 MG tablet Take 20 mg by mouth daily.     triamcinolone cream (KENALOG) 0.1 % Apply topically 2 (two) times daily as needed.     triamcinolone ointment (KENALOG) 0.5 % Apply topically.     No current facility-administered medications on file prior to visit.    Allergies  Allergen Reactions   Lyrica [Pregabalin]     dizziness    Objective:  General: Alert and oriented x3 in no acute distress  Dermatology: Keratotic lesion present 5th met base on left and left 4th toe and Right 5th MTPJ with skin lines transversing the lesion, pain is present with  direct pressure to the lesion with a central nucleated core noted, no webspace macerations, no ecchymosis bilateral, all nails x 10 are thick and discolored elongated with mild subungal debris.   Vascular: Dorsalis Pedis 1 out of 4 and Posterior Tibial pedal pulses 0/4, Capillary Fill Time 5 seconds, + scant pedal hair growth bilateral, no edema bilateral lower extremities, Temperature gradient within normal limits.  Neurology: Johney Maine sensation intact via light touch bilateral.  Musculoskeletal: Mild tenderness with palpation at the keratotic lesion site on left 4th toe>Right 5th MTPJ, severely limited first MPJ range of motion right greater than left with dorsal bony exostosis and mild digital contracture, there is limited midtarsal joint range of motion was not supportive  of arthritis left and right foot, muscular strength 4/5 in all groups without major pain on range of motion however limited as above.  Pes planus foot type.  Assessment and Plan: Problem List Items Addressed This Visit   None Visit Diagnoses     Pain due to onychomycosis of toenails of both feet    -  Primary   PVD (peripheral vascular disease) ([place])       Corns and callosities           -Complete examination performed -Discussed treatment options for callus -Parred keratoic lesion using a chisel blade x2 left and right foot without  -Encouraged daily skin emollients -Discussed treatment options for thickened painful nails -Mechanically debrided painful nails x10 using a sterile nail nipper without incident -Patient to return to office in 12-16 weeks for callus follow-up or sooner if condition worsens.  Lorenda Peck, DPM

## 2022-10-29 DIAGNOSIS — N183 Chronic kidney disease, stage 3 unspecified: Secondary | ICD-10-CM | POA: Diagnosis not present

## 2022-10-29 DIAGNOSIS — R011 Cardiac murmur, unspecified: Secondary | ICD-10-CM | POA: Diagnosis not present

## 2022-10-29 DIAGNOSIS — R7303 Prediabetes: Secondary | ICD-10-CM | POA: Diagnosis not present

## 2022-10-29 DIAGNOSIS — I129 Hypertensive chronic kidney disease with stage 1 through stage 4 chronic kidney disease, or unspecified chronic kidney disease: Secondary | ICD-10-CM | POA: Diagnosis not present

## 2022-10-29 DIAGNOSIS — M109 Gout, unspecified: Secondary | ICD-10-CM | POA: Diagnosis not present

## 2022-10-29 DIAGNOSIS — D649 Anemia, unspecified: Secondary | ICD-10-CM | POA: Diagnosis not present

## 2022-10-29 DIAGNOSIS — E785 Hyperlipidemia, unspecified: Secondary | ICD-10-CM | POA: Diagnosis not present

## 2022-11-09 DIAGNOSIS — S82899A Other fracture of unspecified lower leg, initial encounter for closed fracture: Secondary | ICD-10-CM | POA: Diagnosis not present

## 2022-11-09 DIAGNOSIS — R109 Unspecified abdominal pain: Secondary | ICD-10-CM | POA: Diagnosis not present

## 2022-11-09 DIAGNOSIS — S62661A Nondisplaced fracture of distal phalanx of left index finger, initial encounter for closed fracture: Secondary | ICD-10-CM | POA: Diagnosis not present

## 2022-11-09 DIAGNOSIS — M159 Polyosteoarthritis, unspecified: Secondary | ICD-10-CM | POA: Diagnosis not present

## 2022-11-09 DIAGNOSIS — T07XXXA Unspecified multiple injuries, initial encounter: Secondary | ICD-10-CM | POA: Diagnosis not present

## 2022-11-09 DIAGNOSIS — M25461 Effusion, right knee: Secondary | ICD-10-CM | POA: Diagnosis not present

## 2022-11-09 DIAGNOSIS — N390 Urinary tract infection, site not specified: Secondary | ICD-10-CM | POA: Diagnosis not present

## 2022-11-09 DIAGNOSIS — R918 Other nonspecific abnormal finding of lung field: Secondary | ICD-10-CM | POA: Diagnosis not present

## 2022-11-09 DIAGNOSIS — M542 Cervicalgia: Secondary | ICD-10-CM | POA: Diagnosis not present

## 2022-11-09 DIAGNOSIS — M7989 Other specified soft tissue disorders: Secondary | ICD-10-CM | POA: Diagnosis not present

## 2022-11-09 DIAGNOSIS — M25462 Effusion, left knee: Secondary | ICD-10-CM | POA: Diagnosis not present

## 2022-11-09 DIAGNOSIS — R079 Chest pain, unspecified: Secondary | ICD-10-CM | POA: Diagnosis not present

## 2022-11-09 DIAGNOSIS — R519 Headache, unspecified: Secondary | ICD-10-CM | POA: Diagnosis not present

## 2022-11-09 DIAGNOSIS — W19XXXA Unspecified fall, initial encounter: Secondary | ICD-10-CM | POA: Diagnosis not present

## 2022-11-09 DIAGNOSIS — S92901A Unspecified fracture of right foot, initial encounter for closed fracture: Secondary | ICD-10-CM | POA: Diagnosis not present

## 2022-11-09 DIAGNOSIS — R220 Localized swelling, mass and lump, head: Secondary | ICD-10-CM | POA: Diagnosis not present

## 2022-11-12 ENCOUNTER — Telehealth: Payer: Self-pay

## 2022-11-12 DIAGNOSIS — R011 Cardiac murmur, unspecified: Secondary | ICD-10-CM | POA: Diagnosis not present

## 2022-11-12 DIAGNOSIS — N390 Urinary tract infection, site not specified: Secondary | ICD-10-CM | POA: Diagnosis not present

## 2022-11-12 DIAGNOSIS — S82892A Other fracture of left lower leg, initial encounter for closed fracture: Secondary | ICD-10-CM | POA: Diagnosis not present

## 2022-11-12 DIAGNOSIS — R918 Other nonspecific abnormal finding of lung field: Secondary | ICD-10-CM | POA: Diagnosis not present

## 2022-11-12 DIAGNOSIS — S82891A Other fracture of right lower leg, initial encounter for closed fracture: Secondary | ICD-10-CM | POA: Diagnosis not present

## 2022-11-12 NOTE — Telephone Encounter (Signed)
     Patient  visit on 11/09/2022  at [facility] was for UTI.  Have you been able to follow up with your primary care physician? Patient saw PCP 3/20 and has an upcoming appointment with Ortho.  The patient was or was not able to obtain any needed medicine or equipment.  Are there diet recommendations that you are having difficulty following? No  Patient expresses understanding of discharge instructions and education provided has no other needs at this time. Yes   Molly Ortega   [email or web address]  ?? [id number]   Website: [email or web address]  [email or web address]

## 2022-11-13 DIAGNOSIS — L989 Disorder of the skin and subcutaneous tissue, unspecified: Secondary | ICD-10-CM | POA: Diagnosis not present

## 2022-11-13 DIAGNOSIS — M25572 Pain in left ankle and joints of left foot: Secondary | ICD-10-CM | POA: Diagnosis not present

## 2022-11-13 DIAGNOSIS — M25571 Pain in right ankle and joints of right foot: Secondary | ICD-10-CM | POA: Diagnosis not present

## 2022-11-27 DIAGNOSIS — R001 Bradycardia, unspecified: Secondary | ICD-10-CM | POA: Diagnosis not present

## 2022-12-17 DIAGNOSIS — K5730 Diverticulosis of large intestine without perforation or abscess without bleeding: Secondary | ICD-10-CM | POA: Diagnosis not present

## 2022-12-17 DIAGNOSIS — D631 Anemia in chronic kidney disease: Secondary | ICD-10-CM | POA: Diagnosis not present

## 2022-12-17 DIAGNOSIS — N179 Acute kidney failure, unspecified: Secondary | ICD-10-CM | POA: Diagnosis not present

## 2022-12-17 DIAGNOSIS — I350 Nonrheumatic aortic (valve) stenosis: Secondary | ICD-10-CM | POA: Diagnosis not present

## 2022-12-17 DIAGNOSIS — I083 Combined rheumatic disorders of mitral, aortic and tricuspid valves: Secondary | ICD-10-CM | POA: Diagnosis not present

## 2022-12-17 DIAGNOSIS — I5030 Unspecified diastolic (congestive) heart failure: Secondary | ICD-10-CM | POA: Diagnosis not present

## 2022-12-17 DIAGNOSIS — R079 Chest pain, unspecified: Secondary | ICD-10-CM | POA: Diagnosis not present

## 2022-12-17 DIAGNOSIS — I498 Other specified cardiac arrhythmias: Secondary | ICD-10-CM | POA: Diagnosis not present

## 2022-12-17 DIAGNOSIS — N183 Chronic kidney disease, stage 3 unspecified: Secondary | ICD-10-CM | POA: Diagnosis not present

## 2022-12-17 DIAGNOSIS — R7989 Other specified abnormal findings of blood chemistry: Secondary | ICD-10-CM | POA: Diagnosis not present

## 2022-12-17 DIAGNOSIS — Z950 Presence of cardiac pacemaker: Secondary | ICD-10-CM | POA: Diagnosis not present

## 2022-12-17 DIAGNOSIS — R42 Dizziness and giddiness: Secondary | ICD-10-CM | POA: Diagnosis not present

## 2022-12-17 DIAGNOSIS — Z7982 Long term (current) use of aspirin: Secondary | ICD-10-CM | POA: Diagnosis not present

## 2022-12-17 DIAGNOSIS — J90 Pleural effusion, not elsewhere classified: Secondary | ICD-10-CM | POA: Diagnosis not present

## 2022-12-17 DIAGNOSIS — I272 Pulmonary hypertension, unspecified: Secondary | ICD-10-CM | POA: Diagnosis not present

## 2022-12-17 DIAGNOSIS — E785 Hyperlipidemia, unspecified: Secondary | ICD-10-CM | POA: Diagnosis not present

## 2022-12-17 DIAGNOSIS — I5031 Acute diastolic (congestive) heart failure: Secondary | ICD-10-CM | POA: Diagnosis not present

## 2022-12-17 DIAGNOSIS — I4430 Unspecified atrioventricular block: Secondary | ICD-10-CM | POA: Diagnosis not present

## 2022-12-17 DIAGNOSIS — R001 Bradycardia, unspecified: Secondary | ICD-10-CM | POA: Diagnosis not present

## 2022-12-17 DIAGNOSIS — I509 Heart failure, unspecified: Secondary | ICD-10-CM | POA: Diagnosis not present

## 2022-12-17 DIAGNOSIS — I5033 Acute on chronic diastolic (congestive) heart failure: Secondary | ICD-10-CM | POA: Diagnosis not present

## 2022-12-17 DIAGNOSIS — Z45018 Encounter for adjustment and management of other part of cardiac pacemaker: Secondary | ICD-10-CM | POA: Diagnosis not present

## 2022-12-17 DIAGNOSIS — I130 Hypertensive heart and chronic kidney disease with heart failure and stage 1 through stage 4 chronic kidney disease, or unspecified chronic kidney disease: Secondary | ICD-10-CM | POA: Diagnosis not present

## 2022-12-17 DIAGNOSIS — I359 Nonrheumatic aortic valve disorder, unspecified: Secondary | ICD-10-CM | POA: Diagnosis not present

## 2022-12-17 DIAGNOSIS — I441 Atrioventricular block, second degree: Secondary | ICD-10-CM | POA: Diagnosis not present

## 2022-12-17 DIAGNOSIS — H409 Unspecified glaucoma: Secondary | ICD-10-CM | POA: Diagnosis not present

## 2022-12-17 DIAGNOSIS — I442 Atrioventricular block, complete: Secondary | ICD-10-CM | POA: Diagnosis not present

## 2022-12-17 DIAGNOSIS — E8770 Fluid overload, unspecified: Secondary | ICD-10-CM | POA: Diagnosis not present

## 2022-12-17 DIAGNOSIS — R188 Other ascites: Secondary | ICD-10-CM | POA: Diagnosis not present

## 2022-12-17 DIAGNOSIS — I352 Nonrheumatic aortic (valve) stenosis with insufficiency: Secondary | ICD-10-CM | POA: Diagnosis not present

## 2022-12-17 DIAGNOSIS — I2489 Other forms of acute ischemic heart disease: Secondary | ICD-10-CM | POA: Diagnosis not present

## 2022-12-17 DIAGNOSIS — D649 Anemia, unspecified: Secondary | ICD-10-CM | POA: Diagnosis not present

## 2022-12-23 DIAGNOSIS — N183 Chronic kidney disease, stage 3 unspecified: Secondary | ICD-10-CM | POA: Diagnosis not present

## 2022-12-23 DIAGNOSIS — I130 Hypertensive heart and chronic kidney disease with heart failure and stage 1 through stage 4 chronic kidney disease, or unspecified chronic kidney disease: Secondary | ICD-10-CM | POA: Diagnosis not present

## 2022-12-23 DIAGNOSIS — I5033 Acute on chronic diastolic (congestive) heart failure: Secondary | ICD-10-CM | POA: Diagnosis not present

## 2022-12-23 DIAGNOSIS — I442 Atrioventricular block, complete: Secondary | ICD-10-CM | POA: Diagnosis not present

## 2022-12-23 DIAGNOSIS — Z48812 Encounter for surgical aftercare following surgery on the circulatory system: Secondary | ICD-10-CM | POA: Diagnosis not present

## 2022-12-24 DIAGNOSIS — I350 Nonrheumatic aortic (valve) stenosis: Secondary | ICD-10-CM | POA: Diagnosis not present

## 2022-12-24 DIAGNOSIS — R001 Bradycardia, unspecified: Secondary | ICD-10-CM | POA: Diagnosis not present

## 2022-12-24 DIAGNOSIS — Z01818 Encounter for other preprocedural examination: Secondary | ICD-10-CM | POA: Diagnosis not present

## 2022-12-24 DIAGNOSIS — Z01812 Encounter for preprocedural laboratory examination: Secondary | ICD-10-CM | POA: Diagnosis not present

## 2022-12-24 DIAGNOSIS — I441 Atrioventricular block, second degree: Secondary | ICD-10-CM | POA: Diagnosis not present

## 2022-12-26 DIAGNOSIS — I130 Hypertensive heart and chronic kidney disease with heart failure and stage 1 through stage 4 chronic kidney disease, or unspecified chronic kidney disease: Secondary | ICD-10-CM | POA: Diagnosis not present

## 2022-12-26 DIAGNOSIS — N183 Chronic kidney disease, stage 3 unspecified: Secondary | ICD-10-CM | POA: Diagnosis not present

## 2022-12-26 DIAGNOSIS — I442 Atrioventricular block, complete: Secondary | ICD-10-CM | POA: Diagnosis not present

## 2022-12-26 DIAGNOSIS — I5033 Acute on chronic diastolic (congestive) heart failure: Secondary | ICD-10-CM | POA: Diagnosis not present

## 2022-12-26 DIAGNOSIS — Z48812 Encounter for surgical aftercare following surgery on the circulatory system: Secondary | ICD-10-CM | POA: Diagnosis not present

## 2022-12-27 DIAGNOSIS — I350 Nonrheumatic aortic (valve) stenosis: Secondary | ICD-10-CM | POA: Diagnosis not present

## 2022-12-27 DIAGNOSIS — L8991 Pressure ulcer of unspecified site, stage 1: Secondary | ICD-10-CM | POA: Diagnosis not present

## 2022-12-27 DIAGNOSIS — I509 Heart failure, unspecified: Secondary | ICD-10-CM | POA: Diagnosis not present

## 2022-12-27 DIAGNOSIS — Z950 Presence of cardiac pacemaker: Secondary | ICD-10-CM | POA: Diagnosis not present

## 2022-12-27 DIAGNOSIS — Z79899 Other long term (current) drug therapy: Secondary | ICD-10-CM | POA: Diagnosis not present

## 2022-12-29 DIAGNOSIS — I5033 Acute on chronic diastolic (congestive) heart failure: Secondary | ICD-10-CM | POA: Diagnosis not present

## 2022-12-29 DIAGNOSIS — I130 Hypertensive heart and chronic kidney disease with heart failure and stage 1 through stage 4 chronic kidney disease, or unspecified chronic kidney disease: Secondary | ICD-10-CM | POA: Diagnosis not present

## 2022-12-29 DIAGNOSIS — Z48812 Encounter for surgical aftercare following surgery on the circulatory system: Secondary | ICD-10-CM | POA: Diagnosis not present

## 2022-12-29 DIAGNOSIS — I442 Atrioventricular block, complete: Secondary | ICD-10-CM | POA: Diagnosis not present

## 2022-12-29 DIAGNOSIS — N183 Chronic kidney disease, stage 3 unspecified: Secondary | ICD-10-CM | POA: Diagnosis not present

## 2023-01-01 DIAGNOSIS — N183 Chronic kidney disease, stage 3 unspecified: Secondary | ICD-10-CM | POA: Diagnosis not present

## 2023-01-01 DIAGNOSIS — Z48812 Encounter for surgical aftercare following surgery on the circulatory system: Secondary | ICD-10-CM | POA: Diagnosis not present

## 2023-01-01 DIAGNOSIS — I5033 Acute on chronic diastolic (congestive) heart failure: Secondary | ICD-10-CM | POA: Diagnosis not present

## 2023-01-01 DIAGNOSIS — I130 Hypertensive heart and chronic kidney disease with heart failure and stage 1 through stage 4 chronic kidney disease, or unspecified chronic kidney disease: Secondary | ICD-10-CM | POA: Diagnosis not present

## 2023-01-01 DIAGNOSIS — I442 Atrioventricular block, complete: Secondary | ICD-10-CM | POA: Diagnosis not present

## 2023-01-02 DIAGNOSIS — I447 Left bundle-branch block, unspecified: Secondary | ICD-10-CM | POA: Diagnosis not present

## 2023-01-02 DIAGNOSIS — I4430 Unspecified atrioventricular block: Secondary | ICD-10-CM | POA: Diagnosis not present

## 2023-01-06 DIAGNOSIS — Z48812 Encounter for surgical aftercare following surgery on the circulatory system: Secondary | ICD-10-CM | POA: Diagnosis not present

## 2023-01-06 DIAGNOSIS — I5033 Acute on chronic diastolic (congestive) heart failure: Secondary | ICD-10-CM | POA: Diagnosis not present

## 2023-01-06 DIAGNOSIS — I442 Atrioventricular block, complete: Secondary | ICD-10-CM | POA: Diagnosis not present

## 2023-01-06 DIAGNOSIS — I130 Hypertensive heart and chronic kidney disease with heart failure and stage 1 through stage 4 chronic kidney disease, or unspecified chronic kidney disease: Secondary | ICD-10-CM | POA: Diagnosis not present

## 2023-01-06 DIAGNOSIS — N183 Chronic kidney disease, stage 3 unspecified: Secondary | ICD-10-CM | POA: Diagnosis not present

## 2023-01-13 DIAGNOSIS — I130 Hypertensive heart and chronic kidney disease with heart failure and stage 1 through stage 4 chronic kidney disease, or unspecified chronic kidney disease: Secondary | ICD-10-CM | POA: Diagnosis not present

## 2023-01-13 DIAGNOSIS — I442 Atrioventricular block, complete: Secondary | ICD-10-CM | POA: Diagnosis not present

## 2023-01-13 DIAGNOSIS — Z48812 Encounter for surgical aftercare following surgery on the circulatory system: Secondary | ICD-10-CM | POA: Diagnosis not present

## 2023-01-13 DIAGNOSIS — I5033 Acute on chronic diastolic (congestive) heart failure: Secondary | ICD-10-CM | POA: Diagnosis not present

## 2023-01-13 DIAGNOSIS — N183 Chronic kidney disease, stage 3 unspecified: Secondary | ICD-10-CM | POA: Diagnosis not present

## 2023-01-14 ENCOUNTER — Ambulatory Visit: Payer: Medicare Other | Admitting: Podiatry

## 2023-01-20 DIAGNOSIS — Z1152 Encounter for screening for COVID-19: Secondary | ICD-10-CM | POA: Diagnosis not present

## 2023-01-20 DIAGNOSIS — T8203XA Leakage of heart valve prosthesis, initial encounter: Secondary | ICD-10-CM | POA: Diagnosis not present

## 2023-01-20 DIAGNOSIS — I350 Nonrheumatic aortic (valve) stenosis: Secondary | ICD-10-CM | POA: Diagnosis not present

## 2023-01-20 DIAGNOSIS — N183 Chronic kidney disease, stage 3 unspecified: Secondary | ICD-10-CM | POA: Diagnosis not present

## 2023-01-20 DIAGNOSIS — Z471 Aftercare following joint replacement surgery: Secondary | ICD-10-CM | POA: Diagnosis not present

## 2023-01-20 DIAGNOSIS — Z0189 Encounter for other specified special examinations: Secondary | ICD-10-CM | POA: Diagnosis not present

## 2023-01-20 DIAGNOSIS — Z952 Presence of prosthetic heart valve: Secondary | ICD-10-CM | POA: Diagnosis not present

## 2023-01-20 DIAGNOSIS — I130 Hypertensive heart and chronic kidney disease with heart failure and stage 1 through stage 4 chronic kidney disease, or unspecified chronic kidney disease: Secondary | ICD-10-CM | POA: Diagnosis not present

## 2023-01-20 DIAGNOSIS — I5032 Chronic diastolic (congestive) heart failure: Secondary | ICD-10-CM | POA: Diagnosis not present

## 2023-01-20 DIAGNOSIS — Z006 Encounter for examination for normal comparison and control in clinical research program: Secondary | ICD-10-CM | POA: Diagnosis not present

## 2023-01-22 DIAGNOSIS — I350 Nonrheumatic aortic (valve) stenosis: Secondary | ICD-10-CM | POA: Diagnosis not present

## 2023-01-22 DIAGNOSIS — R0602 Shortness of breath: Secondary | ICD-10-CM | POA: Diagnosis not present

## 2023-01-22 DIAGNOSIS — R001 Bradycardia, unspecified: Secondary | ICD-10-CM | POA: Diagnosis not present

## 2023-01-23 DIAGNOSIS — I130 Hypertensive heart and chronic kidney disease with heart failure and stage 1 through stage 4 chronic kidney disease, or unspecified chronic kidney disease: Secondary | ICD-10-CM | POA: Diagnosis not present

## 2023-01-23 DIAGNOSIS — Z48812 Encounter for surgical aftercare following surgery on the circulatory system: Secondary | ICD-10-CM | POA: Diagnosis not present

## 2023-01-23 DIAGNOSIS — I5033 Acute on chronic diastolic (congestive) heart failure: Secondary | ICD-10-CM | POA: Diagnosis not present

## 2023-01-23 DIAGNOSIS — I442 Atrioventricular block, complete: Secondary | ICD-10-CM | POA: Diagnosis not present

## 2023-01-23 DIAGNOSIS — N183 Chronic kidney disease, stage 3 unspecified: Secondary | ICD-10-CM | POA: Diagnosis not present

## 2023-01-27 DIAGNOSIS — R001 Bradycardia, unspecified: Secondary | ICD-10-CM | POA: Diagnosis not present

## 2023-01-27 DIAGNOSIS — I130 Hypertensive heart and chronic kidney disease with heart failure and stage 1 through stage 4 chronic kidney disease, or unspecified chronic kidney disease: Secondary | ICD-10-CM | POA: Diagnosis not present

## 2023-01-27 DIAGNOSIS — I442 Atrioventricular block, complete: Secondary | ICD-10-CM | POA: Diagnosis not present

## 2023-01-27 DIAGNOSIS — N183 Chronic kidney disease, stage 3 unspecified: Secondary | ICD-10-CM | POA: Diagnosis not present

## 2023-01-27 DIAGNOSIS — Z48812 Encounter for surgical aftercare following surgery on the circulatory system: Secondary | ICD-10-CM | POA: Diagnosis not present

## 2023-01-27 DIAGNOSIS — I5033 Acute on chronic diastolic (congestive) heart failure: Secondary | ICD-10-CM | POA: Diagnosis not present

## 2023-01-28 DIAGNOSIS — I509 Heart failure, unspecified: Secondary | ICD-10-CM | POA: Diagnosis not present

## 2023-01-28 DIAGNOSIS — Z950 Presence of cardiac pacemaker: Secondary | ICD-10-CM | POA: Diagnosis not present

## 2023-01-28 DIAGNOSIS — I350 Nonrheumatic aortic (valve) stenosis: Secondary | ICD-10-CM | POA: Diagnosis not present

## 2023-01-28 DIAGNOSIS — N390 Urinary tract infection, site not specified: Secondary | ICD-10-CM | POA: Diagnosis not present

## 2023-01-31 DIAGNOSIS — Z5189 Encounter for other specified aftercare: Secondary | ICD-10-CM | POA: Diagnosis not present

## 2023-02-02 DIAGNOSIS — Z950 Presence of cardiac pacemaker: Secondary | ICD-10-CM | POA: Diagnosis not present

## 2023-02-02 DIAGNOSIS — D649 Anemia, unspecified: Secondary | ICD-10-CM | POA: Diagnosis not present

## 2023-02-02 DIAGNOSIS — I509 Heart failure, unspecified: Secondary | ICD-10-CM | POA: Diagnosis not present

## 2023-02-02 DIAGNOSIS — I350 Nonrheumatic aortic (valve) stenosis: Secondary | ICD-10-CM | POA: Diagnosis not present

## 2023-02-04 DIAGNOSIS — Z961 Presence of intraocular lens: Secondary | ICD-10-CM | POA: Diagnosis not present

## 2023-02-04 DIAGNOSIS — H401132 Primary open-angle glaucoma, bilateral, moderate stage: Secondary | ICD-10-CM | POA: Diagnosis not present

## 2023-02-05 DIAGNOSIS — Z48812 Encounter for surgical aftercare following surgery on the circulatory system: Secondary | ICD-10-CM | POA: Diagnosis not present

## 2023-02-05 DIAGNOSIS — I130 Hypertensive heart and chronic kidney disease with heart failure and stage 1 through stage 4 chronic kidney disease, or unspecified chronic kidney disease: Secondary | ICD-10-CM | POA: Diagnosis not present

## 2023-02-05 DIAGNOSIS — N183 Chronic kidney disease, stage 3 unspecified: Secondary | ICD-10-CM | POA: Diagnosis not present

## 2023-02-05 DIAGNOSIS — I5033 Acute on chronic diastolic (congestive) heart failure: Secondary | ICD-10-CM | POA: Diagnosis not present

## 2023-02-05 DIAGNOSIS — I442 Atrioventricular block, complete: Secondary | ICD-10-CM | POA: Diagnosis not present

## 2023-02-09 ENCOUNTER — Telehealth: Payer: Self-pay

## 2023-02-09 NOTE — Telephone Encounter (Signed)
Transition Care Management Unsuccessful Follow-up Telephone Call  Date of discharge and from where:  02/02/2023 [facility]  Attempts:  1st Attempt  Reason for unsuccessful TCM follow-up call:  No answer/busy  Sanaa Zilberman [facility]  [facility] Population Health Community Resource Care Guide   [email or web address]  ?? [phone]   Website: [email or web address]  [email or web address]

## 2023-02-10 ENCOUNTER — Telehealth: Payer: Self-pay

## 2023-02-10 DIAGNOSIS — I4891 Unspecified atrial fibrillation: Secondary | ICD-10-CM | POA: Diagnosis not present

## 2023-02-10 DIAGNOSIS — H903 Sensorineural hearing loss, bilateral: Secondary | ICD-10-CM | POA: Diagnosis not present

## 2023-02-10 DIAGNOSIS — R9431 Abnormal electrocardiogram [ECG] [EKG]: Secondary | ICD-10-CM | POA: Diagnosis not present

## 2023-02-10 NOTE — Telephone Encounter (Signed)
Transition Care Management Follow-up Telephone Call Date of discharge and from where: 02/02/2023 [facility] How have you been since you were released from the hospital? Patient is feeling better. Any questions or concerns? No  Items Reviewed: Did the pt receive and understand the discharge instructions provided? Yes  Medications obtained and verified? Yes  Other? No  Any new allergies since your discharge? No  Dietary orders reviewed? Yes Do you have support at home? Yes   Follow up appointments reviewed:  PCP Hospital f/u appt confirmed? No  Scheduled to see  on  @ . Specialist Hospital f/u appt confirmed? Yes  Scheduled to see  Stark Klein, MD  @ [facility] . Are transportation arrangements needed? No  If their condition worsens, is the pt aware to call PCP or go to the Emergency Dept.? Yes Was the patient provided with contact information for the PCP's office or ED? Yes Was to pt encouraged to call back with questions or concerns? Yes  Waylan Busta [facility]  [facility] Population Health Community Resource Care Guide   [email or web address]  ?? [phone]   Website: [email or web address]  [email or web address]

## 2023-02-13 DIAGNOSIS — I5033 Acute on chronic diastolic (congestive) heart failure: Secondary | ICD-10-CM | POA: Diagnosis not present

## 2023-02-13 DIAGNOSIS — I130 Hypertensive heart and chronic kidney disease with heart failure and stage 1 through stage 4 chronic kidney disease, or unspecified chronic kidney disease: Secondary | ICD-10-CM | POA: Diagnosis not present

## 2023-02-13 DIAGNOSIS — N183 Chronic kidney disease, stage 3 unspecified: Secondary | ICD-10-CM | POA: Diagnosis not present

## 2023-02-13 DIAGNOSIS — I442 Atrioventricular block, complete: Secondary | ICD-10-CM | POA: Diagnosis not present

## 2023-02-13 DIAGNOSIS — Z48812 Encounter for surgical aftercare following surgery on the circulatory system: Secondary | ICD-10-CM | POA: Diagnosis not present

## 2023-02-17 DIAGNOSIS — I5033 Acute on chronic diastolic (congestive) heart failure: Secondary | ICD-10-CM | POA: Diagnosis not present

## 2023-02-17 DIAGNOSIS — N183 Chronic kidney disease, stage 3 unspecified: Secondary | ICD-10-CM | POA: Diagnosis not present

## 2023-02-17 DIAGNOSIS — Z48812 Encounter for surgical aftercare following surgery on the circulatory system: Secondary | ICD-10-CM | POA: Diagnosis not present

## 2023-02-17 DIAGNOSIS — I442 Atrioventricular block, complete: Secondary | ICD-10-CM | POA: Diagnosis not present

## 2023-02-17 DIAGNOSIS — I130 Hypertensive heart and chronic kidney disease with heart failure and stage 1 through stage 4 chronic kidney disease, or unspecified chronic kidney disease: Secondary | ICD-10-CM | POA: Diagnosis not present

## 2023-02-23 DIAGNOSIS — Z950 Presence of cardiac pacemaker: Secondary | ICD-10-CM | POA: Diagnosis not present

## 2023-02-23 DIAGNOSIS — R0683 Snoring: Secondary | ICD-10-CM | POA: Diagnosis not present

## 2023-02-23 DIAGNOSIS — I350 Nonrheumatic aortic (valve) stenosis: Secondary | ICD-10-CM | POA: Diagnosis not present

## 2023-02-23 DIAGNOSIS — Z801 Family history of malignant neoplasm of trachea, bronchus and lung: Secondary | ICD-10-CM | POA: Diagnosis not present

## 2023-02-23 DIAGNOSIS — Z45018 Encounter for adjustment and management of other part of cardiac pacemaker: Secondary | ICD-10-CM | POA: Diagnosis not present

## 2023-02-23 DIAGNOSIS — R053 Chronic cough: Secondary | ICD-10-CM | POA: Diagnosis not present

## 2023-02-23 DIAGNOSIS — R918 Other nonspecific abnormal finding of lung field: Secondary | ICD-10-CM | POA: Diagnosis not present

## 2023-02-23 DIAGNOSIS — R0600 Dyspnea, unspecified: Secondary | ICD-10-CM | POA: Diagnosis not present

## 2023-02-23 DIAGNOSIS — I130 Hypertensive heart and chronic kidney disease with heart failure and stage 1 through stage 4 chronic kidney disease, or unspecified chronic kidney disease: Secondary | ICD-10-CM | POA: Diagnosis not present

## 2023-02-23 DIAGNOSIS — J690 Pneumonitis due to inhalation of food and vomit: Secondary | ICD-10-CM | POA: Diagnosis not present

## 2023-02-23 DIAGNOSIS — N183 Chronic kidney disease, stage 3 unspecified: Secondary | ICD-10-CM | POA: Diagnosis not present

## 2023-02-23 DIAGNOSIS — E8770 Fluid overload, unspecified: Secondary | ICD-10-CM | POA: Diagnosis not present

## 2023-02-23 DIAGNOSIS — Z7901 Long term (current) use of anticoagulants: Secondary | ICD-10-CM | POA: Diagnosis not present

## 2023-02-23 DIAGNOSIS — I4891 Unspecified atrial fibrillation: Secondary | ICD-10-CM | POA: Diagnosis not present

## 2023-02-23 DIAGNOSIS — I5030 Unspecified diastolic (congestive) heart failure: Secondary | ICD-10-CM | POA: Diagnosis not present

## 2023-02-23 DIAGNOSIS — Z87891 Personal history of nicotine dependence: Secondary | ICD-10-CM | POA: Diagnosis not present

## 2023-02-23 DIAGNOSIS — J479 Bronchiectasis, uncomplicated: Secondary | ICD-10-CM | POA: Diagnosis not present

## 2023-02-23 DIAGNOSIS — R911 Solitary pulmonary nodule: Secondary | ICD-10-CM | POA: Diagnosis not present

## 2023-02-23 DIAGNOSIS — T17908A Unspecified foreign body in respiratory tract, part unspecified causing other injury, initial encounter: Secondary | ICD-10-CM | POA: Diagnosis not present

## 2023-02-25 ENCOUNTER — Ambulatory Visit: Payer: Medicare Other | Admitting: Podiatry

## 2023-02-25 DIAGNOSIS — B351 Tinea unguium: Secondary | ICD-10-CM

## 2023-02-25 DIAGNOSIS — I739 Peripheral vascular disease, unspecified: Secondary | ICD-10-CM

## 2023-02-25 DIAGNOSIS — L84 Corns and callosities: Secondary | ICD-10-CM | POA: Diagnosis not present

## 2023-02-25 NOTE — Progress Notes (Signed)
       Subjective:  Patient ID: Molly Ortega, female    DOB: [birth year],  MRN: [id number]  Molly Ortega presents to clinic today for:  Chief Complaint  Patient presents with   Nail Problem    Routine Foot Care-nail trim    . Patient notes nails are thick and elongated, causing pain in shoe gear when ambulating.  Also has a callus on the plantar aspect of the left midfoot near the fifth met base.  PCP is Marlyn Corporal, PA.  Allergies  Allergen Reactions   Furosemide Diarrhea   Gabapentin Other (See Comments)   Lyrica [Pregabalin]     dizziness    Review of Systems: Negative except as noted in the HPI.  Objective:  There were no vitals filed for this visit.  Molly Ortega is a pleasant 89 y.o. female in NAD. AAO x 3.  Vascular Examination: Patient has palpable DP pulse, absent PT pulse bilateral.  Delayed capillary refill bilateral toes.  Sparse digital hair bilateral.  Proximal to distal cooling WNL bilateral.  +1 pitting edema bilateral legs and ankles  Dermatological Examination: Interspaces are clear with no open lesions noted bilateral.  Nails are 3-75mm thick, with yellowish/brown discoloration, subungual debris and distal onycholysis x10.  There is pain with compression of nails x10.  There are hyperkeratotic lesions noted plantar aspect left fifth met base.  Patient qualifies for at-risk foot care because of PVD, pain and nails.  Assessment/Plan: 1. Dermatophytosis of nail   2. PVD (peripheral vascular disease) (HCC)   3. Callus of foot     Mycotic nails x10 were sharply debrided with sterile nail nippers and power debriding burr to decrease bulk and length.  Hyperkeratotic lesion x 1 was shaved with #312 blade.  Follow-up 3 months   Molly Ortega Molly Ortega, DPM, FACFAS [facility]     [address] [place], [place] [address]                Office [phone]  Fax [phone]

## 2023-03-02 DIAGNOSIS — Z9181 History of falling: Secondary | ICD-10-CM | POA: Diagnosis not present

## 2023-03-02 DIAGNOSIS — I4891 Unspecified atrial fibrillation: Secondary | ICD-10-CM | POA: Diagnosis not present

## 2023-03-02 DIAGNOSIS — Z139 Encounter for screening, unspecified: Secondary | ICD-10-CM | POA: Diagnosis not present

## 2023-03-02 DIAGNOSIS — N183 Chronic kidney disease, stage 3 unspecified: Secondary | ICD-10-CM | POA: Diagnosis not present

## 2023-03-02 DIAGNOSIS — R7303 Prediabetes: Secondary | ICD-10-CM | POA: Diagnosis not present

## 2023-03-02 DIAGNOSIS — I129 Hypertensive chronic kidney disease with stage 1 through stage 4 chronic kidney disease, or unspecified chronic kidney disease: Secondary | ICD-10-CM | POA: Diagnosis not present

## 2023-03-02 DIAGNOSIS — R011 Cardiac murmur, unspecified: Secondary | ICD-10-CM | POA: Diagnosis not present

## 2023-03-02 DIAGNOSIS — E785 Hyperlipidemia, unspecified: Secondary | ICD-10-CM | POA: Diagnosis not present

## 2023-03-02 DIAGNOSIS — M109 Gout, unspecified: Secondary | ICD-10-CM | POA: Diagnosis not present

## 2023-03-02 DIAGNOSIS — M810 Age-related osteoporosis without current pathological fracture: Secondary | ICD-10-CM | POA: Diagnosis not present

## 2023-03-04 DIAGNOSIS — R911 Solitary pulmonary nodule: Secondary | ICD-10-CM | POA: Diagnosis not present

## 2023-03-04 DIAGNOSIS — D649 Anemia, unspecified: Secondary | ICD-10-CM | POA: Diagnosis not present

## 2023-03-04 DIAGNOSIS — J690 Pneumonitis due to inhalation of food and vomit: Secondary | ICD-10-CM | POA: Diagnosis not present

## 2023-03-04 DIAGNOSIS — T17908A Unspecified foreign body in respiratory tract, part unspecified causing other injury, initial encounter: Secondary | ICD-10-CM | POA: Diagnosis not present

## 2023-03-11 DIAGNOSIS — I10 Essential (primary) hypertension: Secondary | ICD-10-CM | POA: Diagnosis not present

## 2023-03-11 DIAGNOSIS — T82223A Leakage of biological heart valve graft, initial encounter: Secondary | ICD-10-CM | POA: Diagnosis not present

## 2023-03-11 DIAGNOSIS — I351 Nonrheumatic aortic (valve) insufficiency: Secondary | ICD-10-CM | POA: Diagnosis not present

## 2023-03-11 DIAGNOSIS — I4891 Unspecified atrial fibrillation: Secondary | ICD-10-CM | POA: Diagnosis not present

## 2023-03-11 DIAGNOSIS — Z959 Presence of cardiac and vascular implant and graft, unspecified: Secondary | ICD-10-CM | POA: Diagnosis not present

## 2023-03-11 DIAGNOSIS — I350 Nonrheumatic aortic (valve) stenosis: Secondary | ICD-10-CM | POA: Diagnosis not present

## 2023-03-11 DIAGNOSIS — Z952 Presence of prosthetic heart valve: Secondary | ICD-10-CM | POA: Diagnosis not present

## 2023-03-23 DIAGNOSIS — I130 Hypertensive heart and chronic kidney disease with heart failure and stage 1 through stage 4 chronic kidney disease, or unspecified chronic kidney disease: Secondary | ICD-10-CM | POA: Diagnosis not present

## 2023-03-23 DIAGNOSIS — Z79899 Other long term (current) drug therapy: Secondary | ICD-10-CM | POA: Diagnosis not present

## 2023-03-23 DIAGNOSIS — Z952 Presence of prosthetic heart valve: Secondary | ICD-10-CM | POA: Diagnosis not present

## 2023-03-23 DIAGNOSIS — I480 Paroxysmal atrial fibrillation: Secondary | ICD-10-CM | POA: Diagnosis not present

## 2023-03-23 DIAGNOSIS — I5032 Chronic diastolic (congestive) heart failure: Secondary | ICD-10-CM | POA: Diagnosis not present

## 2023-03-23 DIAGNOSIS — N189 Chronic kidney disease, unspecified: Secondary | ICD-10-CM | POA: Diagnosis not present

## 2023-03-23 DIAGNOSIS — Z45018 Encounter for adjustment and management of other part of cardiac pacemaker: Secondary | ICD-10-CM | POA: Diagnosis not present

## 2023-03-23 DIAGNOSIS — I4430 Unspecified atrioventricular block: Secondary | ICD-10-CM | POA: Diagnosis not present

## 2023-03-23 DIAGNOSIS — Z7901 Long term (current) use of anticoagulants: Secondary | ICD-10-CM | POA: Diagnosis not present

## 2023-03-26 DIAGNOSIS — H6123 Impacted cerumen, bilateral: Secondary | ICD-10-CM | POA: Diagnosis not present

## 2023-05-25 DIAGNOSIS — Z950 Presence of cardiac pacemaker: Secondary | ICD-10-CM | POA: Diagnosis not present

## 2023-05-25 DIAGNOSIS — Z45018 Encounter for adjustment and management of other part of cardiac pacemaker: Secondary | ICD-10-CM | POA: Diagnosis not present

## 2023-05-27 ENCOUNTER — Ambulatory Visit: Payer: Medicare Other | Admitting: Podiatry

## 2023-05-27 DIAGNOSIS — I739 Peripheral vascular disease, unspecified: Secondary | ICD-10-CM | POA: Diagnosis not present

## 2023-05-27 DIAGNOSIS — M79674 Pain in right toe(s): Secondary | ICD-10-CM

## 2023-05-27 DIAGNOSIS — B351 Tinea unguium: Secondary | ICD-10-CM

## 2023-05-27 DIAGNOSIS — M79675 Pain in left toe(s): Secondary | ICD-10-CM | POA: Diagnosis not present

## 2023-05-27 DIAGNOSIS — L84 Corns and callosities: Secondary | ICD-10-CM | POA: Diagnosis not present

## 2023-05-27 NOTE — Progress Notes (Signed)
Subjective:  Patient ID: Molly Ortega, female    DOB: [birth year],  MRN: [id number]  Molly Ortega presents to clinic today for:  Chief Complaint  Patient presents with   Nail Problem    Routine Foot Care-nail trim    Patient notes nails are thick and elongated, causing pain in shoe gear when ambulating.  Also has a callus on the plantar aspect of the left midfoot near the fifth met base.  PCP is Marlyn Corporal, PA.  Last seen within the past 2 months.  Allergies  Allergen Reactions   Furosemide Diarrhea   Gabapentin Other (See Comments)   Lyrica [Pregabalin]     dizziness    Review of Systems: Negative except as noted in the HPI.  Objective:  There were no vitals filed for this visit.  Molly Ortega is a pleasant 89 y.o. female in NAD. AAO x 3.  Vascular Examination: Patient has palpable DP pulse, absent PT pulse bilateral.  Delayed capillary refill bilateral toes.  Sparse digital hair bilateral.  Proximal to distal cooling WNL bilateral.  +1 pitting edema bilateral legs and ankles  Dermatological Examination: Interspaces are clear with no open lesions noted bilateral.  Nails are 3-67mm thick, with yellowish/brown discoloration, subungual debris and distal onycholysis x10.  There is pain with compression of nails x10.  There are hyperkeratotic lesions noted plantar aspect left fifth met base.  Patient qualifies for at-risk foot care because of PVD, pain and nails.  Assessment/Plan: 1. Pain due to onychomycosis of toenails of both feet   2. Callus of foot   3. PVD (peripheral vascular disease) (HCC)     Mycotic nails x10 were sharply debrided with sterile nail nippers and power debriding burr to decrease bulk and length.  Hyperkeratotic lesion x 1 was shaved with #312 blade.  Follow-up 3 months   Min Tunnell DBurna Mortimer, DPM, FACFAS [facility]     [address] [place], [place] [address]                 Office [phone]  Fax [phone]

## 2023-06-02 DIAGNOSIS — L570 Actinic keratosis: Secondary | ICD-10-CM | POA: Diagnosis not present

## 2023-06-02 DIAGNOSIS — L989 Disorder of the skin and subcutaneous tissue, unspecified: Secondary | ICD-10-CM | POA: Diagnosis not present

## 2023-06-16 DIAGNOSIS — Z23 Encounter for immunization: Secondary | ICD-10-CM | POA: Diagnosis not present

## 2023-07-01 DIAGNOSIS — Z87891 Personal history of nicotine dependence: Secondary | ICD-10-CM | POA: Diagnosis not present

## 2023-07-01 DIAGNOSIS — T17908A Unspecified foreign body in respiratory tract, part unspecified causing other injury, initial encounter: Secondary | ICD-10-CM | POA: Diagnosis not present

## 2023-07-01 DIAGNOSIS — Z23 Encounter for immunization: Secondary | ICD-10-CM | POA: Diagnosis not present

## 2023-07-01 DIAGNOSIS — R911 Solitary pulmonary nodule: Secondary | ICD-10-CM | POA: Diagnosis not present

## 2023-07-01 DIAGNOSIS — R053 Chronic cough: Secondary | ICD-10-CM | POA: Diagnosis not present

## 2023-07-01 DIAGNOSIS — R0602 Shortness of breath: Secondary | ICD-10-CM | POA: Diagnosis not present

## 2023-07-07 DIAGNOSIS — I350 Nonrheumatic aortic (valve) stenosis: Secondary | ICD-10-CM | POA: Diagnosis not present

## 2023-07-07 DIAGNOSIS — M810 Age-related osteoporosis without current pathological fracture: Secondary | ICD-10-CM | POA: Diagnosis not present

## 2023-07-07 DIAGNOSIS — M109 Gout, unspecified: Secondary | ICD-10-CM | POA: Diagnosis not present

## 2023-07-07 DIAGNOSIS — N183 Chronic kidney disease, stage 3 unspecified: Secondary | ICD-10-CM | POA: Diagnosis not present

## 2023-07-07 DIAGNOSIS — I4891 Unspecified atrial fibrillation: Secondary | ICD-10-CM | POA: Diagnosis not present

## 2023-07-07 DIAGNOSIS — D649 Anemia, unspecified: Secondary | ICD-10-CM | POA: Diagnosis not present

## 2023-07-07 DIAGNOSIS — Z950 Presence of cardiac pacemaker: Secondary | ICD-10-CM | POA: Diagnosis not present

## 2023-07-07 DIAGNOSIS — R7303 Prediabetes: Secondary | ICD-10-CM | POA: Diagnosis not present

## 2023-07-07 DIAGNOSIS — E785 Hyperlipidemia, unspecified: Secondary | ICD-10-CM | POA: Diagnosis not present

## 2023-07-07 DIAGNOSIS — I480 Paroxysmal atrial fibrillation: Secondary | ICD-10-CM | POA: Diagnosis not present

## 2023-07-07 DIAGNOSIS — I129 Hypertensive chronic kidney disease with stage 1 through stage 4 chronic kidney disease, or unspecified chronic kidney disease: Secondary | ICD-10-CM | POA: Diagnosis not present

## 2023-07-15 DIAGNOSIS — Z961 Presence of intraocular lens: Secondary | ICD-10-CM | POA: Diagnosis not present

## 2023-07-15 DIAGNOSIS — H401132 Primary open-angle glaucoma, bilateral, moderate stage: Secondary | ICD-10-CM | POA: Diagnosis not present

## 2023-07-28 DIAGNOSIS — H6123 Impacted cerumen, bilateral: Secondary | ICD-10-CM | POA: Diagnosis not present

## 2023-07-28 DIAGNOSIS — R918 Other nonspecific abnormal finding of lung field: Secondary | ICD-10-CM | POA: Diagnosis not present

## 2023-08-24 DIAGNOSIS — Z45018 Encounter for adjustment and management of other part of cardiac pacemaker: Secondary | ICD-10-CM | POA: Diagnosis not present

## 2023-08-24 DIAGNOSIS — Z950 Presence of cardiac pacemaker: Secondary | ICD-10-CM | POA: Diagnosis not present

## 2023-09-02 ENCOUNTER — Ambulatory Visit: Payer: Medicare Other | Admitting: Podiatry

## 2023-09-02 DIAGNOSIS — M79675 Pain in left toe(s): Secondary | ICD-10-CM

## 2023-09-02 DIAGNOSIS — I739 Peripheral vascular disease, unspecified: Secondary | ICD-10-CM

## 2023-09-02 DIAGNOSIS — L84 Corns and callosities: Secondary | ICD-10-CM | POA: Diagnosis not present

## 2023-09-02 DIAGNOSIS — B351 Tinea unguium: Secondary | ICD-10-CM | POA: Diagnosis not present

## 2023-09-02 DIAGNOSIS — M79674 Pain in right toe(s): Secondary | ICD-10-CM | POA: Diagnosis not present

## 2023-09-02 NOTE — Progress Notes (Signed)
 Subjective:  Patient ID: Molly Ortega, female    DOB: Mar 17, 1931,  MRN: [id number]  Molly Ortega presents to clinic today for:  Chief Complaint  Patient presents with   [facility]    RFC with callouses, there is a new spot on the arch of left plantar. Skin on heel of left foot is discolored. Not diabetic, takes eliquis.   Patient notes nails are thick and elongated, causing pain in shoe gear when ambulating.  She has calluses left submet 5 on the left heel right heel.  Her daughter is with her today is on her cell phone during the first portion of today's appointment.  PCP is Barbarann Mallie DEL, PA.  Date last seen was around 07/09/2023  Past Medical History:  Diagnosis Date   Arthritis    Chronic cough    cronic , dry ; patient states  ive had this cough for almost all my life, my old doctor, Dr Debby thought it might be from some asthma or allergies but never said for sure   Chronic kidney disease (CKD), stage III (moderate) (HCC)    Chronic pain syndrome 01/06/2018   Essential hypertension 05/19/2018   Glaucoma    Hepatic steatosis    Hyperlipidemia 05/19/2018   Hypertension 05/19/2018   Left bundle branch block 07/08/2018   Left bundle branch block (LBBB) on electrocardiogram    Mild aortic valve stenosis    Neuropathy 02/17/2020   Paresthesias 09/25/2017   Peripheral neuropathy    Pre-diabetes    Primary open angle glaucoma (POAG) of both eyes, moderate stage 11/30/2017   Pseudophakia of both eyes 11/30/2017   Rosacea    Rotator cuff tear arthropathy, right 02/02/2019   Sinus bradycardia 03/06/2020   Spinal stenosis of lumbar region with neurogenic claudication 09/01/2018    Allergies  Allergen Reactions   Furosemide  Diarrhea   Gabapentin Other (See Comments)   Lyrica [Pregabalin]     dizziness   Objective:  Molly Ortega is a pleasant 89 y.o. female in NAD. AAO x 3.  Vascular Examination: Patient has palpable DP pulse, absent PT pulse bilateral.  Delayed capillary  refill bilateral toes.  Sparse digital hair bilateral.  Proximal to distal cooling WNL bilateral.    Dermatological Examination: Interspaces are clear with no open lesions noted bilateral.  Skin is shiny and atrophic bilateral.  Nails are 3-71mm thick, with yellowish/brown discoloration, subungual debris and distal onycholysis x10.  There is pain with compression of nails x10.  There are hyperkeratotic lesions noted left submet 5, bilateral plantar heel.  There is some debris in the interspaces today as well as some brown dirt or possible dry fecal material along the lateral left heel.  Patient qualifies for at-risk foot care because of PVD.  Assessment/Plan: 1. Pain due to onychomycosis of toenails of both feet   2. Callus of foot    Mycotic nails x10 were sharply debrided with sterile nail nippers and power debriding burr to decrease bulk and length.  Hyperkeratotic lesions x 3 were shaved with #312 blade.  The interspaces were cleaned lateral aspect of the heel was wiped to remove the debris.  The daughter was unaware that this was present.  Return in about 3 months (around 12/01/2023) for RFC.   Awanda CHARM [place], DPM, FACFAS [facility]     [address]  [place], [place] [address]                Office [phone]  Fax [phone]

## 2023-10-18 DIAGNOSIS — G319 Degenerative disease of nervous system, unspecified: Secondary | ICD-10-CM | POA: Diagnosis not present

## 2023-10-18 DIAGNOSIS — Z87820 Personal history of traumatic brain injury: Secondary | ICD-10-CM | POA: Diagnosis not present

## 2023-10-28 DIAGNOSIS — H905 Unspecified sensorineural hearing loss: Secondary | ICD-10-CM | POA: Diagnosis not present

## 2023-11-10 DIAGNOSIS — M810 Age-related osteoporosis without current pathological fracture: Secondary | ICD-10-CM | POA: Diagnosis not present

## 2023-11-10 DIAGNOSIS — R410 Disorientation, unspecified: Secondary | ICD-10-CM | POA: Diagnosis not present

## 2023-11-10 DIAGNOSIS — I480 Paroxysmal atrial fibrillation: Secondary | ICD-10-CM | POA: Diagnosis not present

## 2023-11-10 DIAGNOSIS — I129 Hypertensive chronic kidney disease with stage 1 through stage 4 chronic kidney disease, or unspecified chronic kidney disease: Secondary | ICD-10-CM | POA: Diagnosis not present

## 2023-11-10 DIAGNOSIS — N183 Chronic kidney disease, stage 3 unspecified: Secondary | ICD-10-CM | POA: Diagnosis not present

## 2023-11-10 DIAGNOSIS — D649 Anemia, unspecified: Secondary | ICD-10-CM | POA: Diagnosis not present

## 2023-11-10 DIAGNOSIS — R7303 Prediabetes: Secondary | ICD-10-CM | POA: Diagnosis not present

## 2023-11-10 DIAGNOSIS — E785 Hyperlipidemia, unspecified: Secondary | ICD-10-CM | POA: Diagnosis not present

## 2023-11-10 DIAGNOSIS — M109 Gout, unspecified: Secondary | ICD-10-CM | POA: Diagnosis not present

## 2023-11-10 DIAGNOSIS — Z950 Presence of cardiac pacemaker: Secondary | ICD-10-CM | POA: Diagnosis not present

## 2023-11-10 DIAGNOSIS — I350 Nonrheumatic aortic (valve) stenosis: Secondary | ICD-10-CM | POA: Diagnosis not present

## 2023-11-23 DIAGNOSIS — Z950 Presence of cardiac pacemaker: Secondary | ICD-10-CM | POA: Diagnosis not present

## 2023-11-23 DIAGNOSIS — I442 Atrioventricular block, complete: Secondary | ICD-10-CM | POA: Diagnosis not present

## 2023-11-23 DIAGNOSIS — Z7901 Long term (current) use of anticoagulants: Secondary | ICD-10-CM | POA: Diagnosis not present

## 2023-11-23 DIAGNOSIS — Z45018 Encounter for adjustment and management of other part of cardiac pacemaker: Secondary | ICD-10-CM | POA: Diagnosis not present

## 2023-11-24 DIAGNOSIS — L989 Disorder of the skin and subcutaneous tissue, unspecified: Secondary | ICD-10-CM | POA: Diagnosis not present

## 2023-11-24 DIAGNOSIS — C44329 Squamous cell carcinoma of skin of other parts of face: Secondary | ICD-10-CM | POA: Diagnosis not present

## 2023-11-24 DIAGNOSIS — D485 Neoplasm of uncertain behavior of skin: Secondary | ICD-10-CM | POA: Diagnosis not present

## 2023-12-02 ENCOUNTER — Ambulatory Visit: Payer: Medicare Other | Admitting: Podiatry

## 2023-12-23 ENCOUNTER — Ambulatory Visit: Admitting: Podiatry

## 2024-01-20 DIAGNOSIS — Z961 Presence of intraocular lens: Secondary | ICD-10-CM | POA: Diagnosis not present

## 2024-01-20 DIAGNOSIS — H401132 Primary open-angle glaucoma, bilateral, moderate stage: Secondary | ICD-10-CM | POA: Diagnosis not present

## 2024-01-26 DIAGNOSIS — C44329 Squamous cell carcinoma of skin of other parts of face: Secondary | ICD-10-CM | POA: Diagnosis not present

## 2024-02-19 DIAGNOSIS — R918 Other nonspecific abnormal finding of lung field: Secondary | ICD-10-CM | POA: Diagnosis not present

## 2024-02-22 DIAGNOSIS — Z45018 Encounter for adjustment and management of other part of cardiac pacemaker: Secondary | ICD-10-CM | POA: Diagnosis not present

## 2024-02-22 DIAGNOSIS — I442 Atrioventricular block, complete: Secondary | ICD-10-CM | POA: Diagnosis not present

## 2024-02-22 DIAGNOSIS — Z7901 Long term (current) use of anticoagulants: Secondary | ICD-10-CM | POA: Diagnosis not present

## 2024-02-22 DIAGNOSIS — Z950 Presence of cardiac pacemaker: Secondary | ICD-10-CM | POA: Diagnosis not present

## 2024-03-16 DIAGNOSIS — I480 Paroxysmal atrial fibrillation: Secondary | ICD-10-CM | POA: Diagnosis not present

## 2024-03-16 DIAGNOSIS — Z952 Presence of prosthetic heart valve: Secondary | ICD-10-CM | POA: Diagnosis not present

## 2024-03-16 DIAGNOSIS — Z950 Presence of cardiac pacemaker: Secondary | ICD-10-CM | POA: Diagnosis not present

## 2024-03-16 DIAGNOSIS — I10 Essential (primary) hypertension: Secondary | ICD-10-CM | POA: Diagnosis not present

## 2024-03-21 DIAGNOSIS — I517 Cardiomegaly: Secondary | ICD-10-CM | POA: Diagnosis not present

## 2024-03-21 DIAGNOSIS — I340 Nonrheumatic mitral (valve) insufficiency: Secondary | ICD-10-CM | POA: Diagnosis not present

## 2024-03-21 DIAGNOSIS — I3481 Nonrheumatic mitral (valve) annulus calcification: Secondary | ICD-10-CM | POA: Diagnosis not present

## 2024-03-21 DIAGNOSIS — Z952 Presence of prosthetic heart valve: Secondary | ICD-10-CM | POA: Diagnosis not present

## 2024-03-24 DIAGNOSIS — I350 Nonrheumatic aortic (valve) stenosis: Secondary | ICD-10-CM | POA: Diagnosis not present

## 2024-03-24 DIAGNOSIS — Z950 Presence of cardiac pacemaker: Secondary | ICD-10-CM | POA: Diagnosis not present

## 2024-03-24 DIAGNOSIS — M109 Gout, unspecified: Secondary | ICD-10-CM | POA: Diagnosis not present

## 2024-03-24 DIAGNOSIS — I129 Hypertensive chronic kidney disease with stage 1 through stage 4 chronic kidney disease, or unspecified chronic kidney disease: Secondary | ICD-10-CM | POA: Diagnosis not present

## 2024-03-24 DIAGNOSIS — N183 Chronic kidney disease, stage 3 unspecified: Secondary | ICD-10-CM | POA: Diagnosis not present

## 2024-03-24 DIAGNOSIS — D649 Anemia, unspecified: Secondary | ICD-10-CM | POA: Diagnosis not present

## 2024-03-24 DIAGNOSIS — E785 Hyperlipidemia, unspecified: Secondary | ICD-10-CM | POA: Diagnosis not present

## 2024-03-24 DIAGNOSIS — R7303 Prediabetes: Secondary | ICD-10-CM | POA: Diagnosis not present

## 2024-03-24 DIAGNOSIS — M810 Age-related osteoporosis without current pathological fracture: Secondary | ICD-10-CM | POA: Diagnosis not present

## 2024-03-24 DIAGNOSIS — R410 Disorientation, unspecified: Secondary | ICD-10-CM | POA: Diagnosis not present

## 2024-03-24 DIAGNOSIS — I480 Paroxysmal atrial fibrillation: Secondary | ICD-10-CM | POA: Diagnosis not present

## 2024-04-19 DIAGNOSIS — I350 Nonrheumatic aortic (valve) stenosis: Secondary | ICD-10-CM | POA: Diagnosis not present

## 2024-04-19 DIAGNOSIS — Z45018 Encounter for adjustment and management of other part of cardiac pacemaker: Secondary | ICD-10-CM | POA: Diagnosis not present

## 2024-04-19 DIAGNOSIS — I130 Hypertensive heart and chronic kidney disease with heart failure and stage 1 through stage 4 chronic kidney disease, or unspecified chronic kidney disease: Secondary | ICD-10-CM | POA: Diagnosis not present

## 2024-04-19 DIAGNOSIS — I480 Paroxysmal atrial fibrillation: Secondary | ICD-10-CM | POA: Diagnosis not present

## 2024-04-19 DIAGNOSIS — Z7901 Long term (current) use of anticoagulants: Secondary | ICD-10-CM | POA: Diagnosis not present

## 2024-04-19 DIAGNOSIS — N189 Chronic kidney disease, unspecified: Secondary | ICD-10-CM | POA: Diagnosis not present

## 2024-04-19 DIAGNOSIS — Z79899 Other long term (current) drug therapy: Secondary | ICD-10-CM | POA: Diagnosis not present

## 2024-04-19 DIAGNOSIS — G629 Polyneuropathy, unspecified: Secondary | ICD-10-CM | POA: Diagnosis not present

## 2024-04-19 DIAGNOSIS — I5032 Chronic diastolic (congestive) heart failure: Secondary | ICD-10-CM | POA: Diagnosis not present

## 2024-04-19 DIAGNOSIS — R9431 Abnormal electrocardiogram [ECG] [EKG]: Secondary | ICD-10-CM | POA: Diagnosis not present

## 2024-05-03 DIAGNOSIS — L989 Disorder of the skin and subcutaneous tissue, unspecified: Secondary | ICD-10-CM | POA: Diagnosis not present

## 2024-05-03 DIAGNOSIS — C44329 Squamous cell carcinoma of skin of other parts of face: Secondary | ICD-10-CM | POA: Diagnosis not present

## 2024-05-03 DIAGNOSIS — L570 Actinic keratosis: Secondary | ICD-10-CM | POA: Diagnosis not present

## 2024-05-04 ENCOUNTER — Ambulatory Visit: Admitting: Podiatry

## 2024-05-04 DIAGNOSIS — M79674 Pain in right toe(s): Secondary | ICD-10-CM

## 2024-05-04 DIAGNOSIS — L600 Ingrowing nail: Secondary | ICD-10-CM | POA: Diagnosis not present

## 2024-05-04 DIAGNOSIS — M79675 Pain in left toe(s): Secondary | ICD-10-CM

## 2024-05-04 DIAGNOSIS — B351 Tinea unguium: Secondary | ICD-10-CM | POA: Diagnosis not present

## 2024-05-04 NOTE — Progress Notes (Unsigned)
 Subjective:  Patient ID: Rollene CHRISTELLA Moats, female    DOB: 04/26/1931,  MRN: [id number]  Rollene CHRISTELLA Moats presents to clinic today for:  Chief Complaint  Patient presents with   Ingrown Toenail    Right hallux, lateral nail border. Ingrown and painful. All nails are long and thick the left hallux is also ingrown but not causing pain. Daughter asked if she could have all her nails cut today, I told her it depended on the Dr and what needs to be done, the ingrown is the most important today.  Not diabetic ASA    Patient notes nails are thick, discolored, elongated and painful in shoegear when trying to ambulate.  She notes that her right great toenail along the lateral nail border feels ingrown.  Denies drainage.  Also notes an area of rough skin on the lateral aspect of the left midfoot.  PCP is Moon, Amy A, NP.  Past Medical History:  Diagnosis Date   Arthritis    Chronic cough    cronic , dry ; patient states  ive had this cough for almost all my life, my old doctor, Dr Debby thought it might be from some asthma or allergies but never said for sure   Chronic kidney disease (CKD), stage III (moderate) (HCC)    Chronic pain syndrome 01/06/2018   Essential hypertension 05/19/2018   Glaucoma    Hepatic steatosis    Hyperlipidemia 05/19/2018   Hypertension 05/19/2018   Left bundle branch block 07/08/2018   Left bundle branch block (LBBB) on electrocardiogram    Mild aortic valve stenosis    Neuropathy 02/17/2020   Paresthesias 09/25/2017   Peripheral neuropathy    Pre-diabetes    Primary open angle glaucoma (POAG) of both eyes, moderate stage 11/30/2017   Pseudophakia of both eyes 11/30/2017   Rosacea    Rotator cuff tear arthropathy, right 02/02/2019   Sinus bradycardia 03/06/2020   Spinal stenosis of lumbar region with neurogenic claudication 09/01/2018   Past Surgical History:  Procedure Laterality Date   CARPAL TUNNEL RELEASE Right    CHOLECYSTECTOMY  2015   DILATION AND  CURETTAGE, DIAGNOSTIC / THERAPEUTIC  1960s   EYE SURGERY Bilateral 08/24/2004   cataract Basic no lensx   REVERSE SHOULDER ARTHROPLASTY Right 02/02/2019   Procedure: REVERSE SHOULDER ARTHROPLASTY;  Surgeon: Cristy Bonner DASEN, MD;  Location: [facility];  Service: Orthopedics;  Laterality: Right;   Allergies  Allergen Reactions   Furosemide  Diarrhea   Gabapentin Other (See Comments)   Lyrica [Pregabalin]     dizziness    Review of Systems: Negative except as noted in the HPI.  Objective:  JURNEI LATINI is a pleasant 89 y.o. female in NAD. AAO x 3.  Vascular Examination: Capillary refill time is 3-5 seconds to toes bilateral.  1/4 palpable pedal pulses b/l LE. Digital hair absent b/l.  Skin temperature gradient WNL b/l.   Dermatological Examination: Pedal skin with normal turgor, texture and tone b/l. No open wounds. No interdigital macerations b/l. Toenails x10 are 3mm thick, discolored, dystrophic with subungual debris. There is pain with compression of the nail plates.  They are elongated x10.  The right hallux along the lateral nail border is slightly incurvated.  There is pain on palpation of the distal lateral border.  No drainage is noted.  Small area of hyperkeratosis on lateral fifth metatarsal base left foot  Assessment/Plan: 1. Pain due to onychomycosis of toenails of both feet   2. Ingrown toenail  The mycotic toenails were sharply debrided x10 with sterile nail nippers and a power debriding burr to decrease bulk/thickness and length.    The hard skin on the lateral midfoot was sanded with a sanding bur.  The right hallux lateral border had 3-WEA and anesthetic spray applied.  It was cut back along the distal edge to provide relief.  If this does not provide continued relief for the patient, we could perform a PNA procedure in the future.  Return in about 3 months (around 08/03/2024) for RFC.   Awanda CHARM Imperial, DPM, FACFAS [facility]     [address]  [place], [place] [address]                Office [phone]  Fax [phone]

## 2024-05-23 DIAGNOSIS — Z950 Presence of cardiac pacemaker: Secondary | ICD-10-CM | POA: Diagnosis not present

## 2024-08-10 ENCOUNTER — Ambulatory Visit: Admitting: Podiatry

## 2024-08-10 DIAGNOSIS — M79674 Pain in right toe(s): Secondary | ICD-10-CM

## 2024-08-10 DIAGNOSIS — B351 Tinea unguium: Secondary | ICD-10-CM | POA: Diagnosis not present

## 2024-08-10 DIAGNOSIS — I739 Peripheral vascular disease, unspecified: Secondary | ICD-10-CM | POA: Diagnosis not present

## 2024-08-10 DIAGNOSIS — M79675 Pain in left toe(s): Secondary | ICD-10-CM

## 2024-08-10 DIAGNOSIS — L84 Corns and callosities: Secondary | ICD-10-CM

## 2024-08-10 NOTE — Progress Notes (Unsigned)
 Nails x10.  Bilateral submet 5 calluses and left fifth metatarsal base callus.  Patient states the right hallux medial nail border still feels ingrown and sore all the time.  Could not bur any of the thickness down due to the polish in place but the medial nail margin was cut back 70% to the eponychium so should give her plenty of relief in the meantime.

## 2024-11-09 ENCOUNTER — Ambulatory Visit: Admitting: Podiatry
